# Patient Record
Sex: Female | Born: 1980 | Race: White | Hispanic: No | Marital: Married | Smoking: Never smoker
Health system: Southern US, Community
[De-identification: ages and names within clinical notes are randomized; demographics above are authoritative.]

## PROBLEM LIST (undated history)

## (undated) DIAGNOSIS — O24419 Gestational diabetes mellitus in pregnancy, unspecified control: Secondary | ICD-10-CM

## (undated) DIAGNOSIS — F329 Major depressive disorder, single episode, unspecified: Secondary | ICD-10-CM

## (undated) DIAGNOSIS — K589 Irritable bowel syndrome without diarrhea: Secondary | ICD-10-CM

## (undated) DIAGNOSIS — T7840XA Allergy, unspecified, initial encounter: Secondary | ICD-10-CM

## (undated) DIAGNOSIS — Z789 Other specified health status: Secondary | ICD-10-CM

## (undated) DIAGNOSIS — F419 Anxiety disorder, unspecified: Secondary | ICD-10-CM

## (undated) DIAGNOSIS — F32A Depression, unspecified: Secondary | ICD-10-CM

## (undated) HISTORY — DX: Allergy, unspecified, initial encounter: T78.40XA

---

## 2016-05-31 LAB — OB RESULTS CONSOLE HEPATITIS B SURFACE ANTIGEN: HEP B S AG: NEGATIVE

## 2016-05-31 LAB — OB RESULTS CONSOLE RPR: RPR: NONREACTIVE

## 2016-05-31 LAB — OB RESULTS CONSOLE HIV ANTIBODY (ROUTINE TESTING): HIV: NONREACTIVE

## 2016-05-31 LAB — OB RESULTS CONSOLE GBS: STREP GROUP B AG: POSITIVE

## 2016-06-02 LAB — OB RESULTS CONSOLE RUBELLA ANTIBODY, IGM: RUBELLA: IMMUNE

## 2016-06-02 LAB — OB RESULTS CONSOLE ABO/RH: RH Type: POSITIVE

## 2016-06-02 LAB — OB RESULTS CONSOLE ANTIBODY SCREEN: ANTIBODY SCREEN: NEGATIVE

## 2016-09-13 ENCOUNTER — Inpatient Hospital Stay (HOSPITAL_COMMUNITY): Admission: AD | Admit: 2016-09-13 | Payer: Self-pay | Source: Ambulatory Visit | Admitting: Obstetrics and Gynecology

## 2016-12-17 ENCOUNTER — Inpatient Hospital Stay (HOSPITAL_COMMUNITY): Payer: Commercial Managed Care - PPO | Admitting: Anesthesiology

## 2016-12-17 ENCOUNTER — Encounter (HOSPITAL_COMMUNITY): Payer: Self-pay | Admitting: *Deleted

## 2016-12-17 ENCOUNTER — Inpatient Hospital Stay (HOSPITAL_COMMUNITY)
Admission: AD | Admit: 2016-12-17 | Discharge: 2016-12-21 | DRG: 766 | Disposition: A | Discharge status: Home / Self Care | Payer: Commercial Managed Care - PPO | Source: Ambulatory Visit | Attending: Obstetrics and Gynecology | Admitting: Obstetrics and Gynecology

## 2016-12-17 DIAGNOSIS — E669 Obesity, unspecified: Secondary | ICD-10-CM | POA: Diagnosis present

## 2016-12-17 DIAGNOSIS — Z6832 Body mass index (BMI) 32.0-32.9, adult: Secondary | ICD-10-CM | POA: Diagnosis not present

## 2016-12-17 DIAGNOSIS — O4292 Full-term premature rupture of membranes, unspecified as to length of time between rupture and onset of labor: Secondary | ICD-10-CM | POA: Diagnosis present

## 2016-12-17 DIAGNOSIS — O99824 Streptococcus B carrier state complicating childbirth: Secondary | ICD-10-CM | POA: Diagnosis present

## 2016-12-17 DIAGNOSIS — O324XX0 Maternal care for high head at term, not applicable or unspecified: Principal | ICD-10-CM | POA: Diagnosis present

## 2016-12-17 DIAGNOSIS — O99214 Obesity complicating childbirth: Secondary | ICD-10-CM | POA: Diagnosis present

## 2016-12-17 DIAGNOSIS — Z98891 History of uterine scar from previous surgery: Secondary | ICD-10-CM

## 2016-12-17 DIAGNOSIS — Z3490 Encounter for supervision of normal pregnancy, unspecified, unspecified trimester: Secondary | ICD-10-CM

## 2016-12-17 DIAGNOSIS — Z3A39 39 weeks gestation of pregnancy: Secondary | ICD-10-CM

## 2016-12-17 HISTORY — DX: Other specified health status: Z78.9

## 2016-12-17 LAB — ABO/RH: ABO/RH(D): A POS

## 2016-12-17 LAB — CBC
HEMATOCRIT: 32.1 % — AB (ref 36.0–46.0)
Hemoglobin: 11.2 g/dL — ABNORMAL LOW (ref 12.0–15.0)
MCH: 31 pg (ref 26.0–34.0)
MCHC: 34.9 g/dL (ref 30.0–36.0)
MCV: 88.9 fL (ref 78.0–100.0)
Platelets: 239 10*3/uL (ref 150–400)
RBC: 3.61 MIL/uL — ABNORMAL LOW (ref 3.87–5.11)
RDW: 13.2 % (ref 11.5–15.5)
WBC: 9.6 10*3/uL (ref 4.0–10.5)

## 2016-12-17 LAB — RPR: RPR Ser Ql: NONREACTIVE

## 2016-12-17 LAB — TYPE AND SCREEN
ABO/RH(D): A POS
ANTIBODY SCREEN: NEGATIVE

## 2016-12-17 LAB — POCT FERN TEST: POCT FERN TEST: POSITIVE

## 2016-12-17 MED ORDER — OXYCODONE-ACETAMINOPHEN 5-325 MG PO TABS: 1.0000 | ORAL_TABLET | ORAL | Status: DC | PRN

## 2016-12-17 MED ORDER — ACETAMINOPHEN 325 MG PO TABS: 650.0000 mg | ORAL_TABLET | ORAL | Status: DC | PRN

## 2016-12-17 MED ORDER — LACTATED RINGERS IV SOLN: 500.0000 mL | Freq: Once | INTRAVENOUS | Status: DC

## 2016-12-17 MED ORDER — PENICILLIN G POTASSIUM 5000000 UNITS IJ SOLR: 5.0000 10*6.[IU] | Freq: Once | INTRAVENOUS | Status: DC

## 2016-12-17 MED ORDER — LACTATED RINGERS IV SOLN
INTRAVENOUS | Status: DC
  Administered 2016-12-17: 14:00:00 via INTRAVENOUS

## 2016-12-17 MED ORDER — DIPHENHYDRAMINE HCL 50 MG/ML IJ SOLN: 12.5000 mg | INTRAMUSCULAR | Status: DC | PRN

## 2016-12-17 MED ORDER — BUTORPHANOL TARTRATE 1 MG/ML IJ SOLN: 1.0000 mg | INTRAMUSCULAR | Status: DC | PRN

## 2016-12-17 MED ORDER — FENTANYL 2.5 MCG/ML BUPIVACAINE 1/10 % EPIDURAL INFUSION (WH - ANES)
14.0000 mL/h | INTRAMUSCULAR | Status: DC | PRN
  Administered 2016-12-17 (×3): 14 mL/h via EPIDURAL
  Filled 2016-12-17 (×3): qty 100

## 2016-12-17 MED ORDER — EPHEDRINE 5 MG/ML INJ: 10.0000 mg | INTRAVENOUS | Status: DC | PRN

## 2016-12-17 MED ORDER — LIDOCAINE HCL (PF) 1 % IJ SOLN
30.0000 mL | INTRAMUSCULAR | Status: DC | PRN
  Filled 2016-12-17: qty 30

## 2016-12-17 MED ORDER — LIDOCAINE HCL (PF) 1 % IJ SOLN
INTRAMUSCULAR | Status: DC | PRN
  Administered 2016-12-17: 5 mL via EPIDURAL
  Administered 2016-12-17: 3 mL via EPIDURAL
  Administered 2016-12-17: 2 mL via EPIDURAL

## 2016-12-17 MED ORDER — PENICILLIN G POT IN DEXTROSE 60000 UNIT/ML IV SOLN: 3.0000 10*6.[IU] | INTRAVENOUS | Status: DC

## 2016-12-17 MED ORDER — ONDANSETRON HCL 4 MG/2ML IJ SOLN: 4.0000 mg | Freq: Four times a day (QID) | INTRAMUSCULAR | Status: DC | PRN

## 2016-12-17 MED ORDER — OXYCODONE-ACETAMINOPHEN 5-325 MG PO TABS: 2.0000 | ORAL_TABLET | ORAL | Status: DC | PRN

## 2016-12-17 MED ORDER — OXYTOCIN BOLUS FROM INFUSION: 500.0000 mL | Freq: Once | INTRAVENOUS | Status: DC

## 2016-12-17 MED ORDER — LACTATED RINGERS IV SOLN: 500.0000 mL | INTRAVENOUS | Status: DC | PRN

## 2016-12-17 MED ORDER — PHENYLEPHRINE 40 MCG/ML (10ML) SYRINGE FOR IV PUSH (FOR BLOOD PRESSURE SUPPORT)
80.0000 ug | PREFILLED_SYRINGE | INTRAVENOUS | Status: DC | PRN
  Filled 2016-12-17: qty 10

## 2016-12-17 MED ORDER — PHENYLEPHRINE 40 MCG/ML (10ML) SYRINGE FOR IV PUSH (FOR BLOOD PRESSURE SUPPORT): 80.0000 ug | PREFILLED_SYRINGE | INTRAVENOUS | Status: DC | PRN

## 2016-12-17 MED ORDER — SOD CITRATE-CITRIC ACID 500-334 MG/5ML PO SOLN
30.0000 mL | ORAL | Status: DC | PRN
  Administered 2016-12-18: 30 mL via ORAL
  Filled 2016-12-17: qty 15

## 2016-12-17 MED ORDER — VANCOMYCIN HCL IN DEXTROSE 1-5 GM/200ML-% IV SOLN
1000.0000 mg | Freq: Two times a day (BID) | INTRAVENOUS | Status: DC
  Administered 2016-12-17 (×2): 1000 mg via INTRAVENOUS
  Filled 2016-12-17 (×3): qty 200

## 2016-12-17 MED ORDER — OXYTOCIN 40 UNITS IN LACTATED RINGERS INFUSION - SIMPLE MED
2.5000 [IU]/h | INTRAVENOUS | Status: DC
  Filled 2016-12-17: qty 1000

## 2016-12-17 NOTE — Anesthesia Pain Management Evaluation Note (Signed)
  CRNA Pain Management Visit Note  Patient: Lauren Schroeder, 35 y.o., female  "Hello I am a member of the anesthesia team at [facility]. We have an anesthesia team available at all times to provide care throughout the hospital, including epidural management and anesthesia for C-section. I don't know your plan for the delivery whether it a natural birth, water birth, IV sedation, nitrous supplementation, doula or epidural, but we want to meet your pain goals."   1.Was your pain managed to your expectations on prior hospitalizations?   No prior hospitalizations  2.What is your expectation for pain management during this hospitalization?     Epidural  3.How can we help you reach that goal? unsure  Record the patient's initial score and the patient's pain goal.   Pain: 3  Pain Goal: 7 [facility] wants you to be able to say your pain was always managed very well.  Cephus ShellingBURGER,Lauren Schroeder 12/17/2016

## 2016-12-17 NOTE — Anesthesia Procedure Notes (Signed)
Epidural Patient location during procedure: OB Start time: 12/17/2016 9:00 AM End time: 12/17/2016 9:05 AM  Staffing Anesthesiologist: Cecile HearingURK, STEPHEN EDWARD Performed: anesthesiologist   Preanesthetic Checklist Completed: patient identified, pre-op evaluation, timeout performed, IV checked, risks and benefits discussed and monitors and equipment checked  Epidural Patient position: sitting Prep: DuraPrep Patient monitoring: blood pressure and continuous pulse ox Approach: midline Location: L3-L4 Injection technique: LOR air  Needle:  Needle type: Tuohy  Needle gauge: 17 G Needle length: 9 cm Needle insertion depth: 5 cm Catheter size: 19 Gauge Catheter at skin depth: 10 cm Test dose: negative and Other (1% Lidocaine)  Additional Notes Patient identified.  Risk benefits discussed including failed block, incomplete pain control, headache, nerve damage, paralysis, blood pressure changes, nausea, vomiting, reactions to medication both toxic or allergic, and postpartum back pain.  Patient expressed understanding and wished to proceed.  All questions were answered.  Sterile technique used throughout procedure and epidural site dressed with sterile barrier dressing. No paresthesia or other complications noted. The patient did not experience any signs of intravascular injection such as tinnitus or metallic taste in mouth nor signs of intrathecal spread such as rapid motor block. Please see nursing notes for vital signs. Reason for block:procedure for pain

## 2016-12-17 NOTE — H&P (Signed)
Vallory Madelin RearDillon is a 36 y.o. female G1P0 @ 39+4 presenting for SROM/labor.  Pregnancy uncomplicated.  OB History    Gravida Para Term Preterm AB Living   1 0 0 0 0 0   SAB TAB Ectopic Multiple Live Births   0 0 0 0 0     Past Medical History:  Diagnosis Date  . Medical history non-contributory    No past surgical history on file. Family History: family history is not on file. Social History:  reports that she has never smoked. She has never used smokeless tobacco. She reports that she does not drink alcohol or use drugs.     Maternal Diabetes: No Genetic Screening: Normal Maternal Ultrasounds/Referrals: Normal Fetal Ultrasounds or other Referrals:  None Maternal Substance Abuse:  No Significant Maternal Medications:  Meds include: Other: celexa Significant Maternal Lab Results:  None Other Comments:  None  ROS History Dilation: 6 Effacement (%): 100 Station: -1 Exam by:: Darell Saputo Blood pressure 113/70, pulse 78, temperature 98.1 F (36.7 C), temperature source Oral, resp. rate 18, height 5\' 3"  (1.6 m), weight 182 lb (82.6 kg), SpO2 99 %. Exam Physical Exam  Prenatal labs: ABO, Rh: --/--/A POS (02/02 16100817) Antibody: NEG (02/02 0817) Rubella: Immune (07/19 0000) RPR: Nonreactive (07/17 0000)  HBsAg: Negative (07/17 0000)  HIV: Non-reactive (07/17 0000)  GBS: Positive (07/17 0000)   Assessment/Plan: Labor Vancomycin for GBS Epidural    Kacee Koren 12/17/2016, 10:09 AM

## 2016-12-17 NOTE — Anesthesia Preprocedure Evaluation (Addendum)
Anesthesia Evaluation  Patient identified by MRN, date of birth, ID band Patient awake    Reviewed: Allergy & Precautions, NPO status , Patient's Chart, lab work & pertinent test results  Airway Mallampati: II  TM Distance: >3 FB Neck ROM: Full    Dental  (+) Teeth Intact, Dental Advisory Given   Pulmonary neg pulmonary ROS,    Pulmonary exam normal breath sounds clear to auscultation       Cardiovascular negative cardio ROS Normal cardiovascular exam Rhythm:Regular Rate:Normal     Neuro/Psych negative neurological ROS     GI/Hepatic negative GI ROS, Neg liver ROS,   Endo/Other  Obesity   Renal/GU negative Renal ROS     Musculoskeletal negative musculoskeletal ROS (+)   Abdominal   Peds  Hematology  (+) Blood dyscrasia, anemia , Plt 239k   Anesthesia Other Findings Day of surgery medications reviewed with the patient.  Reproductive/Obstetrics (+) Pregnancy                             Anesthesia Physical Anesthesia Plan  ASA: II  Anesthesia Plan: Epidural   Post-op Pain Management:    Induction:   Airway Management Planned: Natural Airway  Additional Equipment:   Intra-op Plan:   Post-operative Plan:   Informed Consent: I have reviewed the patients History and Physical, chart, labs and discussed the procedure including the risks, benefits and alternatives for the proposed anesthesia with the patient or authorized representative who has indicated his/her understanding and acceptance.   Dental advisory given  Plan Discussed with: Anesthesiologist, CRNA and Surgeon  Anesthesia Plan Comments: (Patient identified. Risks/Benefits/Options discussed with patient including but not limited to bleeding, infection, nerve damage, paralysis, failed block, incomplete pain control, headache, blood pressure changes, nausea, vomiting, reactions to medication both or allergic, itching and  postpartum back pain. Confirmed with bedside nurse the patient's most recent platelet count. Confirmed with patient that they are not currently taking any anticoagulation, have any bleeding history or any family history of bleeding disorders. Patient expressed understanding and wished to proceed. All questions were answered.   Patient for C/Section for arrest of descent. Will use Epidural for C/Section. M. Aivan Fillingim,MD)       Anesthesia Quick Evaluation

## 2016-12-17 NOTE — MAU Note (Signed)
Patient presents to mau with c/o leaking clear fluid since 2am. Endorses contractions. Denies VB at this time. +FM. Patient desires an epidural.

## 2016-12-18 ENCOUNTER — Encounter (HOSPITAL_COMMUNITY): Payer: Self-pay

## 2016-12-18 ENCOUNTER — Encounter (HOSPITAL_COMMUNITY)
Admission: AD | Disposition: A | Discharge status: Home / Self Care | Payer: Self-pay | Source: Ambulatory Visit | Attending: Obstetrics and Gynecology

## 2016-12-18 DIAGNOSIS — Z98891 History of uterine scar from previous surgery: Secondary | ICD-10-CM

## 2016-12-18 LAB — CBC
HCT: 25.9 % — ABNORMAL LOW (ref 36.0–46.0)
HEMOGLOBIN: 9.1 g/dL — AB (ref 12.0–15.0)
MCH: 31.6 pg (ref 26.0–34.0)
MCHC: 35.1 g/dL (ref 30.0–36.0)
MCV: 89.9 fL (ref 78.0–100.0)
Platelets: 181 10*3/uL (ref 150–400)
RBC: 2.88 MIL/uL — AB (ref 3.87–5.11)
RDW: 13.4 % (ref 11.5–15.5)
WBC: 11.9 10*3/uL — AB (ref 4.0–10.5)

## 2016-12-18 SURGERY — Surgical Case
Anesthesia: Epidural

## 2016-12-18 MED ORDER — MORPHINE SULFATE (PF) 0.5 MG/ML IJ SOLN
INTRAMUSCULAR | Status: AC
  Filled 2016-12-18: qty 10

## 2016-12-18 MED ORDER — DIBUCAINE 1 % RE OINT: 1.0000 "application " | TOPICAL_OINTMENT | RECTAL | Status: DC | PRN

## 2016-12-18 MED ORDER — ONDANSETRON HCL 4 MG/2ML IJ SOLN
INTRAMUSCULAR | Status: DC | PRN
  Administered 2016-12-18: 4 mg via INTRAVENOUS

## 2016-12-18 MED ORDER — ACETAMINOPHEN 500 MG PO TABS
1000.0000 mg | ORAL_TABLET | Freq: Four times a day (QID) | ORAL | Status: AC
  Administered 2016-12-18 – 2016-12-19 (×3): 1000 mg via ORAL
  Filled 2016-12-18 (×3): qty 2

## 2016-12-18 MED ORDER — SENNOSIDES-DOCUSATE SODIUM 8.6-50 MG PO TABS
2.0000 | ORAL_TABLET | ORAL | Status: DC
  Administered 2016-12-19 – 2016-12-20 (×2): 2 via ORAL
  Filled 2016-12-18 (×2): qty 2

## 2016-12-18 MED ORDER — ONDANSETRON HCL 4 MG/2ML IJ SOLN: 4.0000 mg | Freq: Three times a day (TID) | INTRAMUSCULAR | Status: DC | PRN

## 2016-12-18 MED ORDER — NALOXONE HCL 0.4 MG/ML IJ SOLN: 0.4000 mg | INTRAMUSCULAR | Status: DC | PRN

## 2016-12-18 MED ORDER — SIMETHICONE 80 MG PO CHEW
80.0000 mg | CHEWABLE_TABLET | Freq: Three times a day (TID) | ORAL | Status: DC
  Administered 2016-12-18 – 2016-12-21 (×10): 80 mg via ORAL
  Filled 2016-12-18 (×10): qty 1

## 2016-12-18 MED ORDER — ACETAMINOPHEN 325 MG PO TABS
650.0000 mg | ORAL_TABLET | ORAL | Status: DC | PRN
  Administered 2016-12-19 (×2): 650 mg via ORAL
  Filled 2016-12-18: qty 2

## 2016-12-18 MED ORDER — CLINDAMYCIN PHOSPHATE 900 MG/50ML IV SOLN: 900.0000 mg | Freq: Once | INTRAVENOUS | Status: DC

## 2016-12-18 MED ORDER — NALBUPHINE HCL 10 MG/ML IJ SOLN: 5.0000 mg | INTRAMUSCULAR | Status: DC | PRN

## 2016-12-18 MED ORDER — MORPHINE SULFATE (PF) 0.5 MG/ML IJ SOLN
INTRAMUSCULAR | Status: DC | PRN
  Administered 2016-12-18: 4 mg via EPIDURAL

## 2016-12-18 MED ORDER — SIMETHICONE 80 MG PO CHEW
80.0000 mg | CHEWABLE_TABLET | ORAL | Status: DC
  Administered 2016-12-19 – 2016-12-20 (×2): 80 mg via ORAL
  Filled 2016-12-18 (×2): qty 1

## 2016-12-18 MED ORDER — LIDOCAINE-EPINEPHRINE (PF) 2 %-1:200000 IJ SOLN
INTRAMUSCULAR | Status: AC
Start: 2016-12-18 — End: 2016-12-18
  Filled 2016-12-18: qty 20

## 2016-12-18 MED ORDER — MEPERIDINE HCL 25 MG/ML IJ SOLN
INTRAMUSCULAR | Status: AC
  Filled 2016-12-18: qty 1

## 2016-12-18 MED ORDER — SCOPOLAMINE 1 MG/3DAYS TD PT72
MEDICATED_PATCH | TRANSDERMAL | Status: DC | PRN
  Administered 2016-12-18: 1 via TRANSDERMAL

## 2016-12-18 MED ORDER — ONDANSETRON HCL 4 MG/2ML IJ SOLN
INTRAMUSCULAR | Status: AC
  Filled 2016-12-18: qty 2

## 2016-12-18 MED ORDER — MEASLES, MUMPS & RUBELLA VAC ~~LOC~~ INJ
0.5000 mL | INJECTION | Freq: Once | SUBCUTANEOUS | Status: DC
  Filled 2016-12-18: qty 0.5

## 2016-12-18 MED ORDER — COCONUT OIL OIL
1.0000 "application " | TOPICAL_OIL | Status: DC | PRN
  Administered 2016-12-19: 1 via TOPICAL
  Filled 2016-12-18: qty 120

## 2016-12-18 MED ORDER — OXYTOCIN 40 UNITS IN LACTATED RINGERS INFUSION - SIMPLE MED: 2.5000 [IU]/h | INTRAVENOUS | Status: AC

## 2016-12-18 MED ORDER — DIPHENHYDRAMINE HCL 25 MG PO CAPS: 25.0000 mg | ORAL_CAPSULE | Freq: Four times a day (QID) | ORAL | Status: DC | PRN

## 2016-12-18 MED ORDER — SODIUM CHLORIDE 0.9 % IV SOLN: INTRAVENOUS | Status: DC | PRN

## 2016-12-18 MED ORDER — LACTATED RINGERS IV SOLN
INTRAVENOUS | Status: DC | PRN
  Administered 2016-12-18 (×3): via INTRAVENOUS

## 2016-12-18 MED ORDER — WITCH HAZEL-GLYCERIN EX PADS: 1.0000 "application " | MEDICATED_PAD | CUTANEOUS | Status: DC | PRN

## 2016-12-18 MED ORDER — MEDROXYPROGESTERONE ACETATE 150 MG/ML IM SUSP: 150.0000 mg | INTRAMUSCULAR | Status: DC | PRN

## 2016-12-18 MED ORDER — OXYTOCIN 10 UNIT/ML IJ SOLN
INTRAMUSCULAR | Status: AC
Start: 2016-12-18 — End: 2016-12-18
  Filled 2016-12-18: qty 4

## 2016-12-18 MED ORDER — DIPHENHYDRAMINE HCL 50 MG/ML IJ SOLN: 12.5000 mg | INTRAMUSCULAR | Status: DC | PRN

## 2016-12-18 MED ORDER — CITALOPRAM HYDROBROMIDE 40 MG PO TABS
40.0000 mg | ORAL_TABLET | Freq: Every day | ORAL | Status: DC
  Administered 2016-12-18 – 2016-12-20 (×3): 40 mg via ORAL
  Filled 2016-12-18 (×3): qty 1

## 2016-12-18 MED ORDER — SIMETHICONE 80 MG PO CHEW: 80.0000 mg | CHEWABLE_TABLET | ORAL | Status: DC | PRN

## 2016-12-18 MED ORDER — SODIUM BICARBONATE 8.4 % IV SOLN
INTRAVENOUS | Status: AC
  Filled 2016-12-18: qty 50

## 2016-12-18 MED ORDER — SODIUM BICARBONATE 8.4 % IV SOLN
INTRAVENOUS | Status: DC | PRN
  Administered 2016-12-18 (×4): 5 mL via EPIDURAL

## 2016-12-18 MED ORDER — IBUPROFEN 600 MG PO TABS
600.0000 mg | ORAL_TABLET | Freq: Four times a day (QID) | ORAL | Status: DC | PRN
  Administered 2016-12-19 – 2016-12-20 (×2): 600 mg via ORAL

## 2016-12-18 MED ORDER — MEPERIDINE HCL 25 MG/ML IJ SOLN
INTRAMUSCULAR | Status: DC | PRN
  Administered 2016-12-18 (×2): 12.5 mg via INTRAVENOUS

## 2016-12-18 MED ORDER — MEPERIDINE HCL 25 MG/ML IJ SOLN: 6.2500 mg | INTRAMUSCULAR | Status: DC | PRN

## 2016-12-18 MED ORDER — IBUPROFEN 600 MG PO TABS
600.0000 mg | ORAL_TABLET | Freq: Four times a day (QID) | ORAL | Status: DC
  Administered 2016-12-18 – 2016-12-21 (×9): 600 mg via ORAL
  Filled 2016-12-18 (×13): qty 1

## 2016-12-18 MED ORDER — NALOXONE HCL 2 MG/2ML IJ SOSY
1.0000 ug/kg/h | PREFILLED_SYRINGE | INTRAMUSCULAR | Status: DC | PRN
  Filled 2016-12-18: qty 2

## 2016-12-18 MED ORDER — OXYTOCIN 10 UNIT/ML IJ SOLN
INTRAVENOUS | Status: DC | PRN
  Administered 2016-12-18: 40 [IU] via INTRAVENOUS

## 2016-12-18 MED ORDER — TETANUS-DIPHTH-ACELL PERTUSSIS 5-2.5-18.5 LF-MCG/0.5 IM SUSP: 0.5000 mL | Freq: Once | INTRAMUSCULAR | Status: DC

## 2016-12-18 MED ORDER — PRENATAL MULTIVITAMIN CH
1.0000 | ORAL_TABLET | Freq: Every day | ORAL | Status: DC
  Administered 2016-12-19 – 2016-12-20 (×2): 1 via ORAL
  Filled 2016-12-18 (×3): qty 1

## 2016-12-18 MED ORDER — DIPHENHYDRAMINE HCL 25 MG PO CAPS
25.0000 mg | ORAL_CAPSULE | ORAL | Status: DC | PRN
  Filled 2016-12-18: qty 1

## 2016-12-18 MED ORDER — KETOROLAC TROMETHAMINE 30 MG/ML IJ SOLN: 30.0000 mg | Freq: Four times a day (QID) | INTRAMUSCULAR | Status: AC | PRN

## 2016-12-18 MED ORDER — GENTAMICIN SULFATE 40 MG/ML IJ SOLN
Freq: Once | INTRAVENOUS | Status: AC
  Administered 2016-12-18: 114 mL via INTRAVENOUS
  Filled 2016-12-18: qty 8

## 2016-12-18 MED ORDER — GENTAMICIN SULFATE 40 MG/ML IJ SOLN: 1.5000 mg/kg | Freq: Once | INTRAVENOUS | Status: DC

## 2016-12-18 MED ORDER — OXYCODONE-ACETAMINOPHEN 5-325 MG PO TABS
1.0000 | ORAL_TABLET | ORAL | Status: DC | PRN
  Administered 2016-12-19 – 2016-12-20 (×2): 1 via ORAL
  Filled 2016-12-18 (×2): qty 1

## 2016-12-18 MED ORDER — FENTANYL CITRATE (PF) 100 MCG/2ML IJ SOLN
INTRAMUSCULAR | Status: AC
  Filled 2016-12-18: qty 2

## 2016-12-18 MED ORDER — NALBUPHINE HCL 10 MG/ML IJ SOLN: 5.0000 mg | Freq: Once | INTRAMUSCULAR | Status: DC | PRN

## 2016-12-18 MED ORDER — KETOROLAC TROMETHAMINE 30 MG/ML IJ SOLN
INTRAMUSCULAR | Status: AC
  Filled 2016-12-18: qty 1

## 2016-12-18 MED ORDER — SODIUM CHLORIDE 0.9% FLUSH: 3.0000 mL | INTRAVENOUS | Status: DC | PRN

## 2016-12-18 MED ORDER — OXYCODONE-ACETAMINOPHEN 5-325 MG PO TABS
2.0000 | ORAL_TABLET | ORAL | Status: DC | PRN
  Administered 2016-12-20: 2 via ORAL
  Filled 2016-12-18: qty 2

## 2016-12-18 MED ORDER — SODIUM CHLORIDE 0.9 % IR SOLN
Status: DC | PRN
  Administered 2016-12-18 (×2): 1

## 2016-12-18 MED ORDER — DEXTROSE IN LACTATED RINGERS 5 % IV SOLN
INTRAVENOUS | Status: DC
  Administered 2016-12-18 (×2): via INTRAVENOUS

## 2016-12-18 MED ORDER — FENTANYL CITRATE (PF) 100 MCG/2ML IJ SOLN
25.0000 ug | INTRAMUSCULAR | Status: DC | PRN
  Administered 2016-12-18 (×2): 50 ug via INTRAVENOUS
  Administered 2016-12-18: 25 ug via INTRAVENOUS

## 2016-12-18 MED ORDER — MENTHOL 3 MG MT LOZG: 1.0000 | LOZENGE | OROMUCOSAL | Status: DC | PRN

## 2016-12-18 MED ORDER — SCOPOLAMINE 1 MG/3DAYS TD PT72
MEDICATED_PATCH | TRANSDERMAL | Status: AC
  Filled 2016-12-18: qty 1

## 2016-12-18 MED ORDER — KETOROLAC TROMETHAMINE 30 MG/ML IJ SOLN
30.0000 mg | Freq: Four times a day (QID) | INTRAMUSCULAR | Status: AC | PRN
  Administered 2016-12-18: 30 mg via INTRAMUSCULAR

## 2016-12-18 SURGICAL SUPPLY — 31 items
CHLORAPREP W/TINT 26ML (MISCELLANEOUS) ×3 IMPLANT
CLAMP CORD UMBIL (MISCELLANEOUS) IMPLANT
CLOTH BEACON ORANGE TIMEOUT ST (SAFETY) ×3 IMPLANT
DERMABOND ADVANCED (GAUZE/BANDAGES/DRESSINGS) ×2
DERMABOND ADVANCED .7 DNX12 (GAUZE/BANDAGES/DRESSINGS) ×1 IMPLANT
DRSG OPSITE 11X17.75 LRG (GAUZE/BANDAGES/DRESSINGS) ×3 IMPLANT
DRSG OPSITE POSTOP 4X10 (GAUZE/BANDAGES/DRESSINGS) ×3 IMPLANT
ELECT REM PT RETURN 9FT ADLT (ELECTROSURGICAL) ×3
ELECTRODE REM PT RTRN 9FT ADLT (ELECTROSURGICAL) ×1 IMPLANT
EXTRACTOR VACUUM M CUP 4 TUBE (SUCTIONS) IMPLANT
EXTRACTOR VACUUM M CUP 4' TUBE (SUCTIONS)
GLOVE BIO SURGEON STRL SZ 6.5 (GLOVE) ×2 IMPLANT
GLOVE BIO SURGEONS STRL SZ 6.5 (GLOVE) ×1
GLOVE BIOGEL PI IND STRL 7.0 (GLOVE) ×2 IMPLANT
GLOVE BIOGEL PI INDICATOR 7.0 (GLOVE) ×4
GOWN STRL REUS W/TWL LRG LVL3 (GOWN DISPOSABLE) ×6 IMPLANT
KIT ABG SYR 3ML LUER SLIP (SYRINGE) IMPLANT
NEEDLE HYPO 25X5/8 SAFETYGLIDE (NEEDLE) IMPLANT
NS IRRIG 1000ML POUR BTL (IV SOLUTION) ×3 IMPLANT
PACK C SECTION WH (CUSTOM PROCEDURE TRAY) ×3 IMPLANT
PAD OB MATERNITY 4.3X12.25 (PERSONAL CARE ITEMS) ×3 IMPLANT
PENCIL SMOKE EVAC W/HOLSTER (ELECTROSURGICAL) ×3 IMPLANT
SUT CHROMIC 0 CT 802H (SUTURE) IMPLANT
SUT CHROMIC 0 CTX 36 (SUTURE) ×15 IMPLANT
SUT MON AB-0 CT1 36 (SUTURE) ×3 IMPLANT
SUT PDS AB 0 CTX 60 (SUTURE) ×3 IMPLANT
SUT PLAIN 0 NONE (SUTURE) IMPLANT
SUT VIC AB 4-0 KS 27 (SUTURE) IMPLANT
SYR BULB 3OZ (MISCELLANEOUS) ×3 IMPLANT
TOWEL OR 17X24 6PK STRL BLUE (TOWEL DISPOSABLE) ×3 IMPLANT
TRAY FOLEY CATH SILVER 14FR (SET/KITS/TRAYS/PACK) IMPLANT

## 2016-12-18 NOTE — Addendum Note (Signed)
Addendum  created 12/18/16 0844 by Junious SilkMelinda Panayiotis [place], CRNA   Charge Capture section accepted, Sign clinical note

## 2016-12-18 NOTE — Progress Notes (Signed)
Subjective: Postpartum Day 1: Cesarean Delivery Patient reports tolerating PO.    Objective: Vital signs in last 24 hours: Temp:  [97.8 F (36.6 C)-100.3 F (37.9 C)] 97.8 F (36.6 C) (02/03 0730) Pulse Rate:  [73-110] 86 (02/03 0730) Resp:  [11-21] 18 (02/03 0730) BP: (94-133)/(48-84) 111/65 (02/03 0730) SpO2:  [94 %-99 %] 98 % (02/03 0730)  Physical Exam:  General: alert and cooperative Lochia: appropriate Uterine Fundus: firm Incision: healing well DVT Evaluation: No evidence of DVT seen on physical exam.   Recent Labs  12/17/16 0817 12/18/16 0615  HGB 11.2* 9.1*  HCT 32.1* 25.9*    Assessment/Plan: Status post Cesarean section. Doing well postoperatively.  Continue current care.  Cletis Muma 12/18/2016, 9:18 AM

## 2016-12-18 NOTE — Lactation Note (Signed)
This note was copied from a baby's chart. Lactation Consultation Note  Patient Name: Lauren Schroeder Today's Date: 12/18/2016 Reason for consult: Initial assessment Infant is 5612 hours old & seen by lactation for initial assessment. Baby was born at 7165w5d and weighed 7 lb 7.2 oz at birth. Baby was asleep in crib when LC entered. Mom reports that they have attempted BF but he has been sleepy and not wanting to suckle. Mom has done some hand expression into a spoon & fed baby her colostrum this way. Mom encouraged to feed baby 8-12 times/24 hours and with feeding cues.  Provided mom with BF booklet, BF resources, and feeding log; mom made aware of O/P services, breastfeeding support groups, community resources, and our phone # for post-discharge questions.  Mom reports she has a Spectra pump and would like some assistance later with how to use it. Discussed how for now, it's important to BF 8-12x in 24hrs and only pump if needed but wait to focus on pumping for returning to work after BF is established.  Encouraged mom to call for lactation at future feedings to assist with latch.     Maternal Data Has patient been taught Hand Expression?: Yes Does the patient have breastfeeding experience prior to this delivery?: No  Feeding    LATCH Score/Interventions                      Lactation Tools Discussed/Used     Consult Status Consult Status: Follow-up Date: 12/19/16 Follow-up type: In-patient    Oneal GroutLaura C Aloysius Heinle 12/18/2016, 3:03 PM

## 2016-12-18 NOTE — Op Note (Signed)
Cesarean Section Procedure Note   Lauren Schroeder  12/17/2016 - 12/18/2016  Indications: arrest of descent   Pre-operative Diagnosis: Primary Cesarean Section for arrest of descent.   Post-operative Diagnosis: Same   Surgeon: Surgeon(s) and Role:    * Zelphia CairoGretchen Brynnlee Cumpian, MD - Primary    * Lazaro ArmsLuther H Eure, MD - Assisting   Assistants: Despina HiddenEure  Anesthesia: epidural   Procedure Details:  The patient was seen in the Holding Room. The risks, benefits, complications, treatment options, and expected outcomes were discussed with the patient. The patient concurred with the proposed plan, giving informed consent. identified as Lauren Schroeder and the procedure verified as C-Section Delivery. A Time Out was held and the above information confirmed.  After induction of anesthesia, the patient was draped and prepped in the usual sterile manner. A transverse was made and carried down through the subcutaneous tissue to the fascia. Fascial incision was made and extended transversely. The fascia was separated from the underlying rectus tissue superiorly and inferiorly. The peritoneum was identified and entered. Peritoneal incision was extended longitudinally. The utero-vesical peritoneal reflection was incised transversely and the bladder flap was bluntly freed from the lower uterine segment. A low transverse uterine incision was made. Delivered from cephalic presentation was a viable female infant.   Cord ph was not sent the umbilical cord was clamped and cut cord blood was obtained for evaluation. The placenta was removed Intact and appeared normal. The uterine outline, tubes and ovaries appeared normal}. The uterine incision was closed with running locked sutures of 0 chromic gut.   Hemostasis was observed. Lavage was carried out until clear.  Peritoneum closed with 0 monocryl. The fascia was then reapproximated with running sutures of 0PDS. The skin was closed with 4-0Vicryl.   Instrument, sponge, and needle counts were  correct prior the abdominal closure and were correct at the conclusion of the case.      Urine Output: clear  Complications: no complications  Disposition: PACU - hemodynamically stable.   Maternal Condition: stable   Baby condition / location:  Couplet care / Skin to Skin  Attending Attestation: I was present and scrubbed for the entire procedure.   Signed: Surgeon(s): Zelphia CairoGretchen Jabre Heo, MD Lazaro ArmsLuther H Eure, MD

## 2016-12-18 NOTE — Anesthesia Postprocedure Evaluation (Signed)
Anesthesia Post Note  Patient: Lauren Schroeder  Procedure(s) Performed: Procedure(s) (LRB): CESAREAN SECTION (N/A)  Patient location during evaluation: PACU Anesthesia Type: Epidural Level of consciousness: awake and alert and oriented Pain management: pain level controlled Vital Signs Assessment: post-procedure vital signs reviewed and stable Respiratory status: spontaneous breathing, nonlabored ventilation and respiratory function stable Cardiovascular status: blood pressure returned to baseline and stable Postop Assessment: no signs of nausea or vomiting, no headache, epidural receding, no backache and patient able to bend at knees Anesthetic complications: no        Last Vitals:  Vitals:   12/18/16 0330 12/18/16 0345  BP: 100/75 108/69  Pulse: 87 87  Resp: 18 18  Temp: 36.8 C     Last Pain:  Vitals:   12/18/16 0345  TempSrc:   PainSc: 5    Pain Goal: Patients Stated Pain Goal: 0 (12/17/16 2131)               Yudith Norlander A.

## 2016-12-18 NOTE — Anesthesia Postprocedure Evaluation (Signed)
Anesthesia Post Note  Patient: Lauren Schroeder  Procedure(s) Performed: Procedure(s) (LRB): CESAREAN SECTION (N/A)  Patient location during evaluation: Mother Baby Anesthesia Type: Epidural Level of consciousness: awake and alert Pain management: pain level controlled Vital Signs Assessment: post-procedure vital signs reviewed and stable Respiratory status: spontaneous breathing, nonlabored ventilation and respiratory function stable Cardiovascular status: stable Postop Assessment: no headache, no backache and epidural receding Anesthetic complications: no        Last Vitals:  Vitals:   12/18/16 0530 12/18/16 0628  BP: (!) 118/59 (!) 105/56  Pulse: 79 80  Resp: 18 18  Temp: 36.8 C 36.6 C    Last Pain:  Vitals:   12/18/16 0628  TempSrc: Oral  PainSc: 0-No pain   Pain Goal: Patients Stated Pain Goal: 0 (12/17/16 2131)               Junious SilkGILBERT,Yerik Zeringue

## 2016-12-18 NOTE — Transfer of Care (Signed)
Immediate Anesthesia Transfer of Care Note  Patient: Lauren Schroeder  Procedure(s) Performed: Procedure(s): CESAREAN SECTION (N/A)  Patient Location: PACU  Anesthesia Type:Epidural  Level of Consciousness: awake  Airway & Oxygen Therapy: Patient Spontanous Breathing  Post-op Assessment: Report given to RN and Post -op Vital signs reviewed and stable  Post vital signs: stable  Last Vitals:  Vitals:   12/18/16 0101 12/18/16 0120  BP: 105/73   Pulse: 97   Resp:    Temp:  36.9 C    Last Pain:  Vitals:   12/18/16 0120  TempSrc: Oral  PainSc:       Patients Stated Pain Goal: 0 (12/17/16 2131)  Complications: No apparent anesthesia complications

## 2016-12-19 ENCOUNTER — Encounter (HOSPITAL_COMMUNITY): Payer: Self-pay | Admitting: Obstetrics and Gynecology

## 2016-12-19 NOTE — Plan of Care (Signed)
Problem: Coping: Goal: Ability to identify and utilize available resources and services will improve Outcome: Progressing Lactation specialist spent time with parents this evening actively helping by assessing baby's ability to latch, exploring compatible options to placing baby to breast, ways to help baby learn to effectively suck.  Mom is able to express 2-2.5 mls of colostrum per recent feeding attempts.

## 2016-12-19 NOTE — Lactation Note (Signed)
This note was copied from a baby's chart. Lactation Consultation Note  Patient Name: Lauren Schroeder Today's Date: 12/19/2016 Reason for consult: Follow-up assessment;Difficult latch   Follow up with mom of 38 hour old infant. Infant with 2 BF, 4 BF attempts, EBM x 3 of 1-2.5 cc, formula x 2 of 10 cc, 4 voids and 4 stool in 24 hours preceding this assessment. LATCH scores 6-7. Infant weight 6 lb 14.4 oz with weight loss of 7%.  Room is dark and family attempting to get a nap. Mom aroused to speak with me briefly. Mom reports RN has been assisting her with feedings and feedings are improving. She has no questions/concerns at this time and [facility].    Maternal Data Formula Feeding for Exclusion: No Has patient been taught Hand Expression?: Yes Does the patient have breastfeeding experience prior to this delivery?: No  Feeding Feeding Type: Breast Milk Length of feed: 15 min  LATCH Score/Interventions Latch: Repeated attempts needed to sustain latch, nipple held in mouth throughout feeding, stimulation needed to elicit sucking reflex. Intervention(s): Skin to skin;Teach feeding cues Intervention(s): Adjust position;Assist with latch;Breast massage  Audible Swallowing: A few with stimulation Intervention(s): Skin to skin;Hand expression Intervention(s): Skin to skin;Hand expression  Type of Nipple: Flat Intervention(s): Hand pump  Comfort (Breast/Nipple): Soft / non-tender     Hold (Positioning): Assistance needed to correctly position infant at breast and maintain latch.  LATCH Score: 6  Lactation Tools Discussed/Used     Consult Status Consult Status: Follow-up Date: 12/20/16 Follow-up type: In-patient    Silas FloodSharon S Zylah Elsbernd 12/19/2016, 4:25 PM

## 2016-12-19 NOTE — Plan of Care (Signed)
Problem: Bowel/Gastric: Goal: Gastrointestinal status will improve Outcome: Progressing Bowel sounds audible   

## 2016-12-19 NOTE — Lactation Note (Signed)
This note was copied from a baby's chart. Lactation Consultation Note  Patient Name: Lauren Schroeder Today's Date: 12/19/2016 Reason for consult: Follow-up assessment;Difficult latch   Follow up with mom at request of mom and RN. Mom reports infant has not fed well today. She reports he has been very gassy and passing a lot of gas and burping. Assisted mom in latching infant. He was noted to have mucous in his throat with repeat episodes of repetitive swallowing. Initially infant would not latch and was biting at the breast.   He was noted to be biting and have his tongue pulled back in his mouth when suckling on a gloved finger. Enc parents to perform suck training prior to latch. Infant did not get into a rhythmic suckling pattern on gloved finger. Mom hand expresed and spoon fed infant 2 cc colostrum. After trying for several minutes infant began having short suckling bursts but would not sustain latch. Mom with firm breasts, semi compressible areola and short shaft everted nipples. We then attempted a # 24 NS, infant did feed for about 10 minutes with use of NS. Showed mom how to apply and clean NS. Enc mom to use NS if unable to get infant to stay on the breast without it.   Discussed with mom that pumping needs to be started within 24 hours if consistently using the NS, mom wanted to begin pumping this evening using her Spectra 2. Mom pumped and obtained about 3 cc colostrum, enc mom to feed infant any EBM that is obtained. Discussed with parents cleaning of pump parts and breast milk storage at room temperature.   Enc mom to feed infant STS 8-12 x in 24 hours at first feeding cues. Enc mom to pump post BF and to feed infant EBM either in NS or with curved tip syringe and finger. Report to Jan, Charity fundraiserN. Enc mom to call out for feeding assistance as needed.    Maternal Data Formula Feeding for Exclusion: No Has patient been taught Hand Expression?: Yes Does the patient have breastfeeding experience  prior to this delivery?: No  Feeding Feeding Type: Breast Fed Length of feed: 10 min  LATCH Score/Interventions Latch: Repeated attempts needed to sustain latch, nipple held in mouth throughout feeding, stimulation needed to elicit sucking reflex. Intervention(s): Skin to skin;Teach feeding cues;Waking techniques Intervention(s): Adjust position;Assist with latch;Breast massage;Breast compression  Audible Swallowing: A few with stimulation Intervention(s): Alternate breast massage;Hand expression;Skin to skin  Type of Nipple: Everted at rest and after stimulation  Comfort (Breast/Nipple): Soft / non-tender     Hold (Positioning): Assistance needed to correctly position infant at breast and maintain latch. Intervention(s): Breastfeeding basics reviewed;Support Pillows;Position options;Skin to skin  LATCH Score: 7  Lactation Tools Discussed/Used Tools: Pump;Nipple Shields Nipple shield size: 24 Breast pump type: Double-Electric Breast Pump (Spectra 2) WIC Program: No Pump Review: Setup, frequency, and cleaning;Milk Storage Initiated by:: Lauren StainSharon Carle Dargan, RN, IBCLC Date initiated:: 12/19/16   Consult Status Consult Status: Follow-up Date: 12/19/16 Follow-up type: In-patient    Lauren Schroeder 12/19/2016, 12:28 AM

## 2016-12-19 NOTE — Progress Notes (Signed)
Subjective: Postpartum Day 1: Cesarean Delivery Patient reports incisional pain, tolerating PO and no problems voiding.  Baby not feeding well.  Objective: Vital signs in last 24 hours: Temp:  [97.6 F (36.4 C)-98.4 F (36.9 C)] 97.7 F (36.5 C) (02/04 0700) Pulse Rate:  [78-86] 82 (02/04 0700) Resp:  [16-18] 18 (02/04 0700) BP: (100-111)/(52-62) 110/57 (02/04 0700) SpO2:  [95 %-100 %] 100 % (02/04 0316)  Physical Exam:  General: alert and cooperative Lochia: appropriate Uterine Fundus: firm Incision: healing well DVT Evaluation: No evidence of DVT seen on physical exam.   Recent Labs  12/17/16 0817 12/18/16 0615  HGB 11.2* 9.1*  HCT 32.1* 25.9*    Assessment/Plan: Status post Cesarean section. Doing well postoperatively.  Continue current care.  Maddux Vanscyoc 12/19/2016, 9:46 AM

## 2016-12-20 ENCOUNTER — Encounter (HOSPITAL_COMMUNITY): Payer: Self-pay | Admitting: General Practice

## 2016-12-20 NOTE — Lactation Note (Addendum)
This note was copied from a baby's chart. Lactation Consultation Note Baby had 9% weight loss. Very skin tone jaundice in appearance. Baby will have bili serum drawn at 0500. Baby hasn't been feeding well. Out put had been good until last 24 hrs. Baby had 9 stools, 7 voids since birth. Explained to parent baby had an extreme large amount first day of life, sometimes may not have a stool for 24 hrs. But after that should be having stools regular again. Mom and FOB are supplementing w/Alimentum after BF. Pumping, giving colostrum then supplementing w/formula. Mom is usinf personal pump. Encouraged to hand express after pumping. Moms breast are starting to fill. Massage breast easy expressed colostrum. Gave mom feeding amount feeding sheet for supplementing. Parents are syring feeding. Demonstrated inserting syring of formula into NS before latching when baby became sleepy. Baby latched and BF well.  Patient Name: Lauren Schroeder Today's Date: 12/20/2016 Reason for consult: Follow-up assessment;Hyperbilirubinemia;Infant weight loss   Maternal Data    Feeding Feeding Type: Breast Fed Length of feed: 20 min  LATCH Score/Interventions Latch: Repeated attempts needed to sustain latch, nipple held in mouth throughout feeding, stimulation needed to elicit sucking reflex. Intervention(s): Skin to skin;Teach feeding cues;Waking techniques Intervention(s): Adjust position;Assist with latch;Breast massage;Breast compression  Audible Swallowing: A few with stimulation Intervention(s): Hand expression;Skin to skin  Type of Nipple: Everted at rest and after stimulation Intervention(s): Double electric pump  Comfort (Breast/Nipple): Filling, red/small blisters or bruises, mild/mod discomfort  Problem noted: Filling Interventions (Filling): Frequent nursing;Double electric pump;Massage  Hold (Positioning): Assistance needed to correctly position infant at breast and maintain latch. Intervention(s):  Breastfeeding basics reviewed;Position options;Support Pillows;Skin to skin  LATCH Score: 6  Lactation Tools Discussed/Used Tools: Pump Nipple shield size: 24 Breast pump type: Double-Electric Breast Pump   Consult Status Consult Status: Follow-up Date: 12/20/16 Follow-up type: In-patient    [name], Diamond NickelLAURA G 12/20/2016, 3:19 AM

## 2016-12-20 NOTE — Progress Notes (Signed)
Subjective: Postpartum Day 2: Cesarean Delivery Patient reports incisional pain, tolerating PO and no problems voiding.    Objective: Vital signs in last 24 hours: Temp:  [97.7 F (36.5 C)-98.1 F (36.7 C)] 97.7 F (36.5 C) (02/05 0542) Pulse Rate:  [62-87] 87 (02/05 0542) Resp:  [18] 18 (02/05 0542) BP: (112-123)/(60-70) 112/70 (02/05 0542)  Physical Exam:  General: alert and cooperative Lochia: appropriate Uterine Fundus: firm Incision: healing well DVT Evaluation: No evidence of DVT seen on physical exam. Negative Homan's sign. No cords or calf tenderness. Calf/Ankle edema is present.   Recent Labs  12/17/16 0817 12/18/16 0615  HGB 11.2* 9.1*  HCT 32.1* 25.9*    Assessment/Plan: Status post Cesarean section. Doing well postoperatively.  Continue current care  desires circ prior to discharge.  CURTIS,CAROL G 12/20/2016, 7:52 AM  Agree with above.   Counseled pt for circ including risk of bleeding, infection and scarring.  All questions were answered and the patient wishes to proceed.  Mitchel HonourMegan Kenzel Ruesch, DO

## 2016-12-21 ENCOUNTER — Ambulatory Visit: Payer: Self-pay

## 2016-12-21 MED ORDER — IBUPROFEN 600 MG PO TABS: 600.0000 mg | ORAL_TABLET | Freq: Four times a day (QID) | ORAL | 1 refills | Status: DC | PRN

## 2016-12-21 NOTE — Lactation Note (Addendum)
This note was copied from a baby's chart. Lactation Consultation Note: Mother paged for latch assistance. She wanted to try to feed infant without the nipple shield. Mother has infant in cross cradle hold. Assist mother with proper positioning and hand placement. I had mother to do reverse pressure exercise to firm nipple. Infant latched on with a shallow latch at first. Adjust infants lower jaw and top lip. Mother taught breast compression. Infant was observed with good rhythmic pattern of suckling and swallowing for 20 mins. Mother taught to do breast compression. Infant nursed very well without the nipple shield. Infant very content after feeding.  Mother has EBM sitting at the bedside to give infant . Advised to offer infant 15 - 20 ml ebm after each breast feeding if infant will take.  Explained to parent paced bottle feeding. [facility]  Parents receptive to all teaching.   Advised mother to allow infant to remove as much milk as possible when full. Post pump for 10 mins to soften breast and then ice to reduce swelling. Mother is aware of available Lactation services . Encouraged mother to follow up with LC as needed.   Patient Name: Lauren Schroeder Today's Date: 12/21/2016 Reason for consult: Follow-up assessment   Maternal Data    Feeding Feeding Type: Breast Fed Length of feed: 20 min  LATCH Score/Interventions Latch: Grasps breast easily, tongue down, lips flanged, rhythmical sucking. Intervention(s): Skin to skin;Teach feeding cues;Waking techniques Intervention(s): Breast compression  Audible Swallowing: Spontaneous and intermittent Intervention(s): Hand expression Intervention(s): Hand expression  Type of Nipple: Everted at rest and after stimulation (slight areola edema) Intervention(s): Reverse pressure  Comfort (Breast/Nipple): Filling, red/small blisters or bruises, mild/mod discomfort  Problem noted:  Filling Interventions (Filling): Frequent nursing  Hold (Positioning): Assistance needed to correctly position infant at breast and maintain latch. Intervention(s): Support Pillows;Position options  LATCH Score: 8  Lactation Tools Discussed/Used     Consult Status Consult Status: Complete    Michel BickersKendrick, Seynabou Fults McCoy 12/21/2016, 1:03 PM

## 2016-12-21 NOTE — Lactation Note (Signed)
This note was copied from a baby's chart. Lactation Consultation Note Checked on baby X 2 during the night and parents were sleeping. Concerned baby hadn't had a stool in 48 hrs. Also LC had discussed w/parents to supplement after every BF. Baby did gained a little weight. Noted baby had 2 stools during the night. On coming LC will follow up. Patient Name: Lauren Schroeder Today's Date: 12/21/2016 Reason for consult: Follow-up assessment;Infant weight loss   Maternal Data    Feeding Feeding Type: Breast Fed Length of feed: 20 min  LATCH Score/Interventions                      Lactation Tools Discussed/Used     Consult Status Consult Status: Follow-up Date: 12/21/16 Follow-up type: In-patient    Annalis Kaczmarczyk, Diamond NickelLAURA G 12/21/2016, 7:03 AM

## 2016-12-21 NOTE — Discharge Summary (Signed)
Obstetric Discharge Summary Reason for Admission: onset of labor Prenatal Procedures: ultrasound Intrapartum Procedures: cesarean: low cervical, transverse Postpartum Procedures: none Complications-Operative and Postpartum: none Hemoglobin  Date Value Ref Range Status  12/18/2016 9.1 (L) 12.0 - 15.0 g/dL Final   HCT  Date Value Ref Range Status  12/18/2016 25.9 (L) 36.0 - 46.0 % Final    Physical Exam:  General: alert and cooperative Lochia: appropriate Uterine Fundus: firm Incision: healing well DVT Evaluation: No evidence of DVT seen on physical exam. Negative Homan's sign. No cords or calf tenderness. No significant calf/ankle edema.  Discharge Diagnoses: Term Pregnancy-delivered  Discharge Information: Date: 12/21/2016 Activity: pelvic rest Diet: routine Medications: PNV, Ibuprofen and celexa Condition: stable Instructions: refer to practice specific booklet Discharge to: home   Newborn Data: Live born female  Birth Weight: 7 lb 7.2 oz (3380 g) APGAR: 10, 10  Home with mother.  Lauren Schroeder G 12/21/2016, 8:25 AM

## 2018-11-13 NOTE — H&P (Signed)
37 yo G2P1 found to have a MAB on office U/S 11/10/18 C/W 8 2/7 weeks CRL. No bleeding.  ROS: no fever, no pain  PMH: anxiety  PSH: negative  OBHx: C/S x 1  Meds: citalopram 20mg  po qd            PNV  All: PCN>hives       Valtrex>hives  FHx: heart dx, DM, leukemia, Bipolar disorder, IBS  PE: Lungs CTA        Cor RRR  A/P: MAB         Options of management reviewed>patient elects D&E         A+

## 2018-11-15 NOTE — L&D Delivery Note (Signed)
Delivery Note/VBAC  Pt was in [facility] specialty care and began with strong ctxs, was checked and found to be 8cm.  Was taken to L&D and was complete.  Pushed and delivered.  SVD viable female Apgars 6,8 over 2nd degree MLL.  Placenta delivered spontaneously intact with 3VC. Repair with 2-0 Chromic with good support and hemostasis noted.  R/V exam confirms.  PH art was sent.   Mother and baby to couplet care and are doing well.  EBL 200cc  Louretta Shorten, MD

## 2018-11-16 ENCOUNTER — Ambulatory Visit (HOSPITAL_COMMUNITY): Payer: Commercial Managed Care - PPO | Admitting: Anesthesiology

## 2018-11-16 ENCOUNTER — Encounter (HOSPITAL_COMMUNITY): Payer: Self-pay

## 2018-11-16 ENCOUNTER — Other Ambulatory Visit: Payer: Self-pay

## 2018-11-16 ENCOUNTER — Encounter (HOSPITAL_COMMUNITY)
Admission: RE | Disposition: A | Discharge status: Home / Self Care | Payer: Self-pay | Source: Home / Self Care | Attending: Obstetrics and Gynecology

## 2018-11-16 ENCOUNTER — Ambulatory Visit (HOSPITAL_COMMUNITY)
Admission: RE | Admit: 2018-11-16 | Discharge: 2018-11-16 | Disposition: A | Discharge status: Home / Self Care | Payer: Commercial Managed Care - PPO | Attending: Obstetrics and Gynecology | Admitting: Obstetrics and Gynecology

## 2018-11-16 DIAGNOSIS — Z79899 Other long term (current) drug therapy: Secondary | ICD-10-CM | POA: Insufficient documentation

## 2018-11-16 DIAGNOSIS — F419 Anxiety disorder, unspecified: Secondary | ICD-10-CM | POA: Diagnosis not present

## 2018-11-16 DIAGNOSIS — O021 Missed abortion: Secondary | ICD-10-CM

## 2018-11-16 HISTORY — DX: Major depressive disorder, single episode, unspecified: F32.9

## 2018-11-16 HISTORY — PX: DILATION AND EVACUATION: SHX1459

## 2018-11-16 HISTORY — DX: Depression, unspecified: F32.A

## 2018-11-16 HISTORY — DX: Anxiety disorder, unspecified: F41.9

## 2018-11-16 LAB — CBC
HCT: 39.7 % (ref 36.0–46.0)
Hemoglobin: 13.2 g/dL (ref 12.0–15.0)
MCH: 31.2 pg (ref 26.0–34.0)
MCHC: 33.2 g/dL (ref 30.0–36.0)
MCV: 93.9 fL (ref 80.0–100.0)
Platelets: 303 10*3/uL (ref 150–400)
RBC: 4.23 MIL/uL (ref 3.87–5.11)
RDW: 12.3 % (ref 11.5–15.5)
WBC: 5.7 10*3/uL (ref 4.0–10.5)
nRBC: 0 % (ref 0.0–0.2)

## 2018-11-16 LAB — TYPE AND SCREEN
ABO/RH(D): A POS
Antibody Screen: NEGATIVE

## 2018-11-16 SURGERY — DILATION AND EVACUATION, UTERUS
Anesthesia: Monitor Anesthesia Care | Site: Vagina

## 2018-11-16 MED ORDER — SCOPOLAMINE 1 MG/3DAYS TD PT72
MEDICATED_PATCH | TRANSDERMAL | Status: AC
  Administered 2018-11-16: 1.5 mg via TRANSDERMAL
  Filled 2018-11-16: qty 1

## 2018-11-16 MED ORDER — PROPOFOL 10 MG/ML IV BOLUS
INTRAVENOUS | Status: AC
  Filled 2018-11-16: qty 20

## 2018-11-16 MED ORDER — ONDANSETRON HCL 4 MG/2ML IJ SOLN
INTRAMUSCULAR | Status: AC
  Filled 2018-11-16: qty 2

## 2018-11-16 MED ORDER — LACTATED RINGERS IV SOLN
INTRAVENOUS | Status: DC
  Administered 2018-11-16: 125 mL/h via INTRAVENOUS
  Administered 2018-11-16: 12:00:00 via INTRAVENOUS

## 2018-11-16 MED ORDER — SOD CITRATE-CITRIC ACID 500-334 MG/5ML PO SOLN
30.0000 mL | ORAL | Status: AC
  Administered 2018-11-16: 30 mL via ORAL

## 2018-11-16 MED ORDER — OXYTOCIN 10 UNIT/ML IJ SOLN
INTRAMUSCULAR | Status: DC | PRN
  Administered 2018-11-16: 10 [IU] via INTRAMUSCULAR

## 2018-11-16 MED ORDER — LACTATED RINGERS IV SOLN
INTRAVENOUS | Status: DC
  Administered 2018-11-16: 12:00:00 via INTRAVENOUS

## 2018-11-16 MED ORDER — MIDAZOLAM HCL 2 MG/2ML IJ SOLN
INTRAMUSCULAR | Status: AC
  Filled 2018-11-16: qty 2

## 2018-11-16 MED ORDER — SOD CITRATE-CITRIC ACID 500-334 MG/5ML PO SOLN
ORAL | Status: AC
  Administered 2018-11-16: 30 mL via ORAL
  Filled 2018-11-16: qty 15

## 2018-11-16 MED ORDER — LIDOCAINE HCL (CARDIAC) PF 100 MG/5ML IV SOSY
PREFILLED_SYRINGE | INTRAVENOUS | Status: AC
  Filled 2018-11-16: qty 5

## 2018-11-16 MED ORDER — GENTAMICIN SULFATE 40 MG/ML IJ SOLN
INTRAVENOUS | Status: AC
  Administered 2018-11-16: 340 mg via INTRAVENOUS
  Filled 2018-11-16: qty 8.5

## 2018-11-16 MED ORDER — LIDOCAINE HCL (CARDIAC) PF 100 MG/5ML IV SOSY
PREFILLED_SYRINGE | INTRAVENOUS | Status: DC | PRN
  Administered 2018-11-16: 60 mg via INTRAVENOUS

## 2018-11-16 MED ORDER — PROPOFOL 10 MG/ML IV BOLUS
INTRAVENOUS | Status: DC | PRN
  Administered 2018-11-16: 20 mg via INTRAVENOUS
  Administered 2018-11-16: 50 mg via INTRAVENOUS
  Administered 2018-11-16 (×4): 20 mg via INTRAVENOUS

## 2018-11-16 MED ORDER — ONDANSETRON HCL 4 MG/2ML IJ SOLN
INTRAMUSCULAR | Status: DC | PRN
  Administered 2018-11-16: 4 mg via INTRAVENOUS

## 2018-11-16 MED ORDER — FENTANYL CITRATE (PF) 100 MCG/2ML IJ SOLN
INTRAMUSCULAR | Status: AC
  Filled 2018-11-16: qty 2

## 2018-11-16 MED ORDER — FENTANYL CITRATE (PF) 100 MCG/2ML IJ SOLN: 25.0000 ug | INTRAMUSCULAR | Status: DC | PRN

## 2018-11-16 MED ORDER — MIDAZOLAM HCL 5 MG/5ML IJ SOLN
INTRAMUSCULAR | Status: DC | PRN
  Administered 2018-11-16: 2 mg via INTRAVENOUS

## 2018-11-16 MED ORDER — LIDOCAINE HCL 1 % IJ SOLN
INTRAMUSCULAR | Status: AC
  Filled 2018-11-16: qty 20

## 2018-11-16 MED ORDER — ACETAMINOPHEN ER 650 MG PO TBCR: 650.0000 mg | EXTENDED_RELEASE_TABLET | Freq: Three times a day (TID) | ORAL | 0 refills | Status: DC | PRN

## 2018-11-16 MED ORDER — PROMETHAZINE HCL 25 MG/ML IJ SOLN: 6.2500 mg | INTRAMUSCULAR | Status: DC | PRN

## 2018-11-16 MED ORDER — IBUPROFEN 600 MG PO TABS: 600.0000 mg | ORAL_TABLET | Freq: Four times a day (QID) | ORAL | 0 refills | Status: DC | PRN

## 2018-11-16 MED ORDER — SCOPOLAMINE 1 MG/3DAYS TD PT72
1.0000 | MEDICATED_PATCH | Freq: Once | TRANSDERMAL | Status: DC
  Administered 2018-11-16: 1.5 mg via TRANSDERMAL

## 2018-11-16 MED ORDER — LIDOCAINE HCL 1 % IJ SOLN
INTRAMUSCULAR | Status: DC | PRN
  Administered 2018-11-16: 20 mL

## 2018-11-16 MED ORDER — OXYCODONE HCL 5 MG PO TABS: 5.0000 mg | ORAL_TABLET | Freq: Once | ORAL | Status: DC | PRN

## 2018-11-16 MED ORDER — FENTANYL CITRATE (PF) 100 MCG/2ML IJ SOLN
INTRAMUSCULAR | Status: DC | PRN
  Administered 2018-11-16 (×2): 50 ug via INTRAVENOUS

## 2018-11-16 MED ORDER — OXYCODONE HCL 5 MG/5ML PO SOLN: 5.0000 mg | Freq: Once | ORAL | Status: DC | PRN

## 2018-11-16 SURGICAL SUPPLY — 18 items
CATH ROBINSON RED A/P 16FR (CATHETERS) ×2 IMPLANT
DECANTER SPIKE VIAL GLASS SM (MISCELLANEOUS) ×2 IMPLANT
GLOVE BIO SURGEON STRL SZ7.5 (GLOVE) ×2 IMPLANT
GLOVE BIOGEL PI IND STRL 7.0 (GLOVE) ×1 IMPLANT
GLOVE BIOGEL PI INDICATOR 7.0 (GLOVE) ×1
GOWN STRL REUS W/TWL LRG LVL3 (GOWN DISPOSABLE) ×4 IMPLANT
HIBICLENS CHG 4% 4OZ BTL (MISCELLANEOUS) ×2 IMPLANT
KIT BERKELEY 1ST TRIMESTER 3/8 (MISCELLANEOUS) ×2 IMPLANT
NS IRRIG 1000ML POUR BTL (IV SOLUTION) ×2 IMPLANT
PACK VAGINAL MINOR WOMEN LF (CUSTOM PROCEDURE TRAY) ×2 IMPLANT
PAD OB MATERNITY 4.3X12.25 (PERSONAL CARE ITEMS) ×2 IMPLANT
PAD PREP 24X48 CUFFED NSTRL (MISCELLANEOUS) ×2 IMPLANT
SET BERKELEY SUCTION TUBING (SUCTIONS) ×2 IMPLANT
TOWEL OR 17X24 6PK STRL BLUE (TOWEL DISPOSABLE) ×4 IMPLANT
VACURETTE 10 RIGID CVD (CANNULA) IMPLANT
VACURETTE 7MM CVD STRL WRAP (CANNULA) IMPLANT
VACURETTE 8 RIGID CVD (CANNULA) ×1 IMPLANT
VACURETTE 9 RIGID CVD (CANNULA) IMPLANT

## 2018-11-16 NOTE — Discharge Instructions (Signed)
DISCHARGE INSTRUCTIONS: D&E The following instructions have been prepared to help you care for yourself upon your return home.   Personal hygiene:  Use sanitary pads for vaginal drainage, not tampons.  Shower the day after your procedure.  NO tub baths, pools or Jacuzzis for 2-3 weeks.  Activity and limitations: Get plenty of rest for the remainder of the day. A responsible individual must stay with you for 24 hours following the procedure.  For the next 24 hours, DO NOT: -Drive a car -Advertising copywriter -Drink alcoholic beverages -Take any medication unless instructed by your physician -Make any legal decisions or sign important papers.  Do NOT rest in bed all day.  Walking is encouraged.  Walk up and down stairs slowly.  You may resume your normal activity in one to two days or as indicated by your physician.  Sexual activity: NO intercourse for at least 2 weeks after the procedure, or as indicated by your physician.  Return to work: You may resume your work activities in one to two days or as indicated by your doctor.  What to expect after your surgery: Expect to have vaginal bleeding/discharge for 2-3 days and spotting for up to 10 days. It is not unusual to have soreness for up to 1-2 weeks. You may have a slight burning sensation when you urinate for the first day. Mild cramps may continue for a couple of days. You may have a regular period in 2-6 weeks.  Call your doctor for any of the following:  Excessive vaginal bleeding, saturating and changing one pad every hour.  Inability to urinate 6 hours after discharge from hospital.  Pain not relieved by pain medication.  Fever of 100.4 F or greater.  Unusual vaginal discharge or odor.  Post Anesthesia Home Care Instructions  Activity: Get plenty of rest for the remainder of the day. A responsible individual must stay with you for 24 hours following the procedure.   Meals: Start with liquid foods such as gelatin  or soup. Progress to regular foods as tolerated. Avoid greasy, spicy, heavy foods. If nausea and/or vomiting occur, drink only clear liquids until the nausea and/or vomiting subsides. Call your physician if vomiting continues.  Special Instructions/Symptoms: Your throat may feel dry or sore from the anesthesia or the breathing tube placed in your throat during surgery. If this causes discomfort, gargle with warm salt water. The discomfort should disappear within 24 hours.  If you had a scopolamine patch placed behind your ear for the management of post- operative nausea and/or vomiting:  1. The medication in the patch is effective for 72 hours, after which it should be removed.  Wrap patch in a tissue and discard in the trash. Wash hands thoroughly with soap and water. 2. You may remove the patch earlier than 72 hours if you experience unpleasant side effects which may include dry mouth, dizziness or visual disturbances. 3. Avoid touching the patch. Wash your hands with soap and water after contact with the patch.

## 2018-11-16 NOTE — Progress Notes (Signed)
No changes to H&P per patient history Reviewed with patient procedure-H/S, D&C D/W risks including infection, organ damage, bleeding/transfusion-HIV/Hep, DVT/PE, pneumonia, IU synechiea and secondary infertility Patient states she understands and agrees

## 2018-11-16 NOTE — Brief Op Note (Signed)
11/16/2018  12:16 PM  PATIENT:  Lauren Schroeder  37 y.o. female  PRE-OPERATIVE DIAGNOSIS:  MISSED AB  POST-OPERATIVE DIAGNOSIS:  missed abortion  PROCEDURE:  Procedure(s): DILATATION AND EVACUATION (N/A)  SURGEON:  Surgeon(s) and Role:    Harold Hedge, MD - Primary  PHYSICIAN ASSISTANT:   ASSISTANTS: none   ANESTHESIA:   IV sedation and paracervical block  EBL:  50 mL   BLOOD ADMINISTERED:none  DRAINS: none   LOCAL MEDICATIONS USED:  LIDOCAINE  and Amount: 20 ml  SPECIMEN:  Source of Specimen:  POC  DISPOSITION OF SPECIMEN:  PATHOLOGY  COUNTS:  YES  TOURNIQUET:  * No tourniquets in log *  DICTATION: .Other Dictation: Dictation Number [id number]  PLAN OF CARE: Discharge to home after PACU  PATIENT DISPOSITION:  PACU - hemodynamically stable.   Delay start of Pharmacological VTE agent (>24hrs) due to surgical blood loss or risk of bleeding: not applicable

## 2018-11-16 NOTE — Transfer of Care (Signed)
Immediate Anesthesia Transfer of Care Note  Patient: Lauren Schroeder  Procedure(s) Performed: DILATATION AND EVACUATION (N/A Vagina )  Patient Location: PACU  Anesthesia Type:MAC  Level of Consciousness: awake, alert  and oriented  Airway & Oxygen Therapy: Patient Spontanous Breathing  Post-op Assessment: Report given to RN and Post -op Vital signs reviewed and stable  Post vital signs: Reviewed and stable  Last Vitals:  Vitals Value Taken Time  BP    Temp    Pulse 72 11/16/2018 12:15 PM  Resp    SpO2 93 % 11/16/2018 12:15 PM  Vitals shown include unvalidated device data.  Last Pain:  Vitals:   11/16/18 1032  TempSrc: Oral  PainSc: 0-No pain      Patients Stated Pain Goal: 5 (89/37/34 2876)  Complications: No apparent anesthesia complications

## 2018-11-16 NOTE — Op Note (Signed)
Lauren Schroeder, Lauren Schroeder MEDICAL RECORD [id number] ACCOUNT [id number] DATE OF BIRTH:01/07/1981 FACILITY: [facility] LOCATION: [facility] PHYSICIAN:Ryer Asato Jamal Collin II, MD  OPERATIVE REPORT  DATE OF PROCEDURE:  11/16/2018  PREOPERATIVE DIAGNOSIS:  Missed abortion.  POSTOPERATIVE DIAGNOSIS:  Missed abortion.  PROCEDURE:  Dilation and evacuation.  SURGEON:  Harold Hedge, MD  ANESTHESIA:  IV sedation and paracervical block.  ESTIMATED BLOOD LOSS:  Less than 100 mL.  SPECIMENS:  Products of conception to pathology.  INDICATIONS AND CONSENT:  This patient is a 38 year old patient with missed abortion.  Details are dictated in the history and physical.  Dilation evacuation was discussed preoperatively.  Potential risks and complications were reviewed with the patient  preoperatively including but not limited to infection, uterine perforation, organ damage, bleeding requiring transfusion of blood products with HIV and hepatitis acquisition, DVT, PE, pneumonia, and intrauterine synechiae and secondary infertility.  The  patient states she understands and agrees, and consent was signed on the chart.  DESCRIPTION OF PROCEDURE:  The patient was taken to the operating room where she was identified, placed in the dorsal supine position and IV sedation was given.  She was placed in the dorsal lithotomy position.  She was prepped and bladder was straight  catheterized, and she was draped in a sterile fashion.  Timeout was undertaken.  Examination revealed the uterus to be 8 weeks in size and retroverted.  Bivalve speculum was placed in the vagina, and the anterior cervical lip was injected with 1%  lidocaine and grasped with a single-tooth tenaculum.  Paracervical block was placed at the 2, 4 5, 7, 8 and 10 o'clock positions with approximately 20 mL of the same solution.  The cervix was gently progressively dilated.  A #8 curved suction curette was  placed in the intrauterine cavity, and suction curettage  was carried out for products of conception.  Ten units of Pitocin were added to the remaining 500 mL of IV fluid after the first pass.  A sharp and alternating suction curettage was carried out.   The cavity was clean.  Good hemostasis was noted.  Instruments were removed.  The patient was awakened and taken to recovery room in stable condition.  Blood type is A positive.  LN/NUANCE  D:11/16/2018 T:11/16/2018 JOB:[id number]

## 2018-11-16 NOTE — Anesthesia Preprocedure Evaluation (Addendum)
Anesthesia Evaluation  Patient identified by MRN, date of birth, ID band Patient awake    Reviewed: Allergy & Precautions, NPO status , Patient's Chart, lab work & pertinent test results  History of Anesthesia Complications Negative for: history of anesthetic complications  Airway Mallampati: II  TM Distance: >3 FB Neck ROM: Full    Dental  (+) Dental Advisory Given   Pulmonary neg pulmonary ROS,    breath sounds clear to auscultation       Cardiovascular negative cardio ROS   Rhythm:Regular Rate:Normal     Neuro/Psych negative neurological ROS  negative psych ROS   GI/Hepatic negative GI ROS, Neg liver ROS,   Endo/Other  negative endocrine ROS  Renal/GU negative Renal ROS     Musculoskeletal negative musculoskeletal ROS (+)   Abdominal   Peds  Hematology negative hematology ROS (+)   Anesthesia Other Findings   Reproductive/Obstetrics                            Anesthesia Physical Anesthesia Plan  ASA: I  Anesthesia Plan: MAC   Post-op Pain Management:    Induction: Intravenous  PONV Risk Score and Plan: 2 and Propofol infusion and Treatment may vary due to age or medical condition  Airway Management Planned: Nasal Cannula and Natural Airway  Additional Equipment: None  Intra-op Plan:   Post-operative Plan:   Informed Consent: I have reviewed the patients History and Physical, chart, labs and discussed the procedure including the risks, benefits and alternatives for the proposed anesthesia with the patient or authorized representative who has indicated his/her understanding and acceptance.     Plan Discussed with: CRNA and Anesthesiologist  Anesthesia Plan Comments:        Anesthesia Quick Evaluation

## 2018-11-16 NOTE — Anesthesia Postprocedure Evaluation (Signed)
Anesthesia Post Note  Patient: Lauren Schroeder  Procedure(s) Performed: DILATATION AND EVACUATION (N/A Vagina )     Patient location during evaluation: PACU Anesthesia Type: MAC Level of consciousness: awake and alert Pain management: pain level controlled Vital Signs Assessment: post-procedure vital signs reviewed and stable Respiratory status: spontaneous breathing, nonlabored ventilation and respiratory function stable Cardiovascular status: stable and blood pressure returned to baseline Anesthetic complications: no    Last Vitals:  Vitals:   11/16/18 1310 11/16/18 1345  BP:  (!) 100/52  Pulse: 61 69  Resp: 14 16  Temp:  36.6 C  SpO2: 100% 100%    Last Pain:  Vitals:   11/16/18 1345  TempSrc:   PainSc: 0-No pain   Pain Goal: Patients Stated Pain Goal: 5 (11/16/18 1032)               Audry Pili

## 2018-11-17 ENCOUNTER — Encounter (HOSPITAL_COMMUNITY): Payer: Self-pay | Admitting: Obstetrics and Gynecology

## 2019-07-25 ENCOUNTER — Encounter: Payer: Self-pay | Admitting: Registered"

## 2019-07-25 ENCOUNTER — Other Ambulatory Visit: Payer: Self-pay

## 2019-07-25 ENCOUNTER — Encounter: Payer: BC Managed Care – PPO | Attending: Obstetrics and Gynecology | Admitting: Registered"

## 2019-07-25 DIAGNOSIS — O99810 Abnormal glucose complicating pregnancy: Secondary | ICD-10-CM | POA: Insufficient documentation

## 2019-07-25 NOTE — Progress Notes (Signed)
Patient was seen on 07/25/2019 for Gestational Diabetes self-management class at the [facility]. The following learning objectives were met by the patient during this course:   States the definition of Gestational Diabetes  States why dietary management is important in controlling blood glucose  Describes the effects each nutrient has on blood glucose levels  Demonstrates ability to create a balanced meal plan  Demonstrates carbohydrate counting   States when to check blood glucose levels  Demonstrates proper blood glucose monitoring techniques  States the effect of stress and exercise on blood glucose levels  States the importance of limiting caffeine and abstaining from alcohol and smoking  Blood glucose monitor given: [employer] # [id number] Exp: 09/15/19 Blood glucose reading: 84 mg/dL  Patient instructed to monitor glucose levels: FBS: 60 - <95; 1 hour: <140; 2 hour: <120  Patient received handouts:  Nutrition Diabetes and Pregnancy, including carb counting list  Patient will be seen for follow-up as needed.

## 2019-09-06 ENCOUNTER — Encounter (HOSPITAL_COMMUNITY): Payer: Self-pay | Admitting: *Deleted

## 2019-09-06 ENCOUNTER — Inpatient Hospital Stay (HOSPITAL_COMMUNITY)
Admission: AD | Admit: 2019-09-06 | Discharge: 2019-09-09 | DRG: 807 | Disposition: A | Discharge status: Home / Self Care | Payer: BC Managed Care – PPO | Attending: Obstetrics and Gynecology | Admitting: Obstetrics and Gynecology

## 2019-09-06 ENCOUNTER — Other Ambulatory Visit: Payer: Self-pay

## 2019-09-06 DIAGNOSIS — Z3A35 35 weeks gestation of pregnancy: Secondary | ICD-10-CM

## 2019-09-06 DIAGNOSIS — O42913 Preterm premature rupture of membranes, unspecified as to length of time between rupture and onset of labor, third trimester: Principal | ICD-10-CM | POA: Diagnosis present

## 2019-09-06 DIAGNOSIS — O34219 Maternal care for unspecified type scar from previous cesarean delivery: Secondary | ICD-10-CM | POA: Diagnosis present

## 2019-09-06 DIAGNOSIS — O99824 Streptococcus B carrier state complicating childbirth: Secondary | ICD-10-CM | POA: Diagnosis present

## 2019-09-06 DIAGNOSIS — Z8759 Personal history of other complications of pregnancy, childbirth and the puerperium: Secondary | ICD-10-CM

## 2019-09-06 DIAGNOSIS — Z20828 Contact with and (suspected) exposure to other viral communicable diseases: Secondary | ICD-10-CM | POA: Diagnosis present

## 2019-09-06 DIAGNOSIS — O24420 Gestational diabetes mellitus in childbirth, diet controlled: Secondary | ICD-10-CM | POA: Diagnosis present

## 2019-09-06 HISTORY — DX: Gestational diabetes mellitus in pregnancy, unspecified control: O24.419

## 2019-09-06 HISTORY — DX: Irritable bowel syndrome, unspecified: K58.9

## 2019-09-06 LAB — POCT FERN TEST: POCT Fern Test: POSITIVE

## 2019-09-06 MED ORDER — BETAMETHASONE SOD PHOS & ACET 6 (3-3) MG/ML IJ SUSP
12.0000 mg | Freq: Two times a day (BID) | INTRAMUSCULAR | Status: DC
  Administered 2019-09-07: 12 mg via INTRAMUSCULAR
  Filled 2019-09-06: qty 5

## 2019-09-06 MED ORDER — LACTATED RINGERS IV SOLN
INTRAVENOUS | Status: DC
  Administered 2019-09-07: 02:00:00 via INTRAVENOUS

## 2019-09-06 NOTE — MAU Note (Signed)
Water broke about 2030. Had large gush and has continued to leak small amt of clear fld. No pain. No recent cervical. For repeat c/s 09/30/19 and desires repeat c/s

## 2019-09-07 ENCOUNTER — Encounter (HOSPITAL_COMMUNITY)
Admission: AD | Disposition: A | Discharge status: Home / Self Care | Payer: Self-pay | Source: Home / Self Care | Attending: Obstetrics and Gynecology

## 2019-09-07 ENCOUNTER — Encounter (HOSPITAL_COMMUNITY): Payer: Self-pay | Admitting: *Deleted

## 2019-09-07 DIAGNOSIS — Z3A35 35 weeks gestation of pregnancy: Secondary | ICD-10-CM | POA: Diagnosis not present

## 2019-09-07 DIAGNOSIS — O24420 Gestational diabetes mellitus in childbirth, diet controlled: Secondary | ICD-10-CM | POA: Diagnosis present

## 2019-09-07 DIAGNOSIS — Z8759 Personal history of other complications of pregnancy, childbirth and the puerperium: Secondary | ICD-10-CM

## 2019-09-07 DIAGNOSIS — O42013 Preterm premature rupture of membranes, onset of labor within 24 hours of rupture, third trimester: Secondary | ICD-10-CM | POA: Diagnosis present

## 2019-09-07 DIAGNOSIS — O34219 Maternal care for unspecified type scar from previous cesarean delivery: Secondary | ICD-10-CM | POA: Diagnosis present

## 2019-09-07 DIAGNOSIS — Z20828 Contact with and (suspected) exposure to other viral communicable diseases: Secondary | ICD-10-CM | POA: Diagnosis present

## 2019-09-07 DIAGNOSIS — O99824 Streptococcus B carrier state complicating childbirth: Secondary | ICD-10-CM | POA: Diagnosis present

## 2019-09-07 DIAGNOSIS — O42913 Preterm premature rupture of membranes, unspecified as to length of time between rupture and onset of labor, third trimester: Secondary | ICD-10-CM | POA: Diagnosis present

## 2019-09-07 LAB — CBC
HCT: 31.6 % — ABNORMAL LOW (ref 36.0–46.0)
HCT: 34.2 % — ABNORMAL LOW (ref 36.0–46.0)
Hemoglobin: 11 g/dL — ABNORMAL LOW (ref 12.0–15.0)
Hemoglobin: 11.9 g/dL — ABNORMAL LOW (ref 12.0–15.0)
MCH: 31.5 pg (ref 26.0–34.0)
MCH: 31.9 pg (ref 26.0–34.0)
MCHC: 34.8 g/dL (ref 30.0–36.0)
MCHC: 34.8 g/dL (ref 30.0–36.0)
MCV: 90.5 fL (ref 80.0–100.0)
MCV: 91.6 fL (ref 80.0–100.0)
Platelets: 223 10*3/uL (ref 150–400)
Platelets: 227 10*3/uL (ref 150–400)
RBC: 3.45 MIL/uL — ABNORMAL LOW (ref 3.87–5.11)
RBC: 3.78 MIL/uL — ABNORMAL LOW (ref 3.87–5.11)
RDW: 12.4 % (ref 11.5–15.5)
RDW: 12.6 % (ref 11.5–15.5)
WBC: 13.2 10*3/uL — ABNORMAL HIGH (ref 4.0–10.5)
WBC: 8.8 10*3/uL (ref 4.0–10.5)
nRBC: 0 % (ref 0.0–0.2)
nRBC: 0 % (ref 0.0–0.2)

## 2019-09-07 LAB — SARS CORONAVIRUS 2 BY RT PCR (HOSPITAL ORDER, PERFORMED IN ~~LOC~~ HOSPITAL LAB): SARS Coronavirus 2: NEGATIVE

## 2019-09-07 LAB — TYPE AND SCREEN
ABO/RH(D): A POS
Antibody Screen: NEGATIVE

## 2019-09-07 LAB — RPR: RPR Ser Ql: NONREACTIVE

## 2019-09-07 LAB — ABO/RH: ABO/RH(D): A POS

## 2019-09-07 SURGERY — Surgical Case
Anesthesia: Regional

## 2019-09-07 MED ORDER — CITALOPRAM HYDROBROMIDE 40 MG PO TABS
40.0000 mg | ORAL_TABLET | Freq: Every day | ORAL | Status: DC
  Administered 2019-09-07: 40 mg via ORAL
  Filled 2019-09-07 (×2): qty 1

## 2019-09-07 MED ORDER — OXYTOCIN 40 UNITS IN NORMAL SALINE INFUSION - SIMPLE MED
2.5000 [IU]/h | INTRAVENOUS | Status: DC
  Administered 2019-09-07: 2.5 [IU]/h via INTRAVENOUS
  Filled 2019-09-07: qty 1000

## 2019-09-07 MED ORDER — FLEET ENEMA 7-19 GM/118ML RE ENEM: 1.0000 | ENEMA | RECTAL | Status: DC | PRN

## 2019-09-07 MED ORDER — OXYCODONE-ACETAMINOPHEN 5-325 MG PO TABS: 1.0000 | ORAL_TABLET | ORAL | Status: DC | PRN

## 2019-09-07 MED ORDER — LACTATED RINGERS IV SOLN: 500.0000 mL | INTRAVENOUS | Status: DC | PRN

## 2019-09-07 MED ORDER — ZOLPIDEM TARTRATE 5 MG PO TABS: 5.0000 mg | ORAL_TABLET | Freq: Every evening | ORAL | Status: DC | PRN

## 2019-09-07 MED ORDER — GENTAMICIN SULFATE 40 MG/ML IJ SOLN
5.0000 mg/kg | INTRAVENOUS | Status: DC
  Filled 2019-09-07 (×2): qty 7.25

## 2019-09-07 MED ORDER — OXYTOCIN BOLUS FROM INFUSION
500.0000 mL | Freq: Once | INTRAVENOUS | Status: AC
  Administered 2019-09-07: 500 mL via INTRAVENOUS

## 2019-09-07 MED ORDER — CLINDAMYCIN PHOSPHATE 900 MG/50ML IV SOLN: 900.0000 mg | INTRAVENOUS | Status: DC

## 2019-09-07 MED ORDER — ACETAMINOPHEN 325 MG PO TABS
650.0000 mg | ORAL_TABLET | ORAL | Status: DC | PRN
  Administered 2019-09-07: 650 mg via ORAL
  Filled 2019-09-07: qty 2

## 2019-09-07 MED ORDER — MEASLES, MUMPS & RUBELLA VAC IJ SOLR: 0.5000 mL | Freq: Once | INTRAMUSCULAR | Status: DC

## 2019-09-07 MED ORDER — GENTAMICIN SULFATE 40 MG/ML IJ SOLN
5.0000 mg/kg | INTRAVENOUS | Status: DC
  Filled 2019-09-07: qty 7.25

## 2019-09-07 MED ORDER — OXYCODONE-ACETAMINOPHEN 5-325 MG PO TABS: 2.0000 | ORAL_TABLET | ORAL | Status: DC | PRN

## 2019-09-07 MED ORDER — IBUPROFEN 600 MG PO TABS
600.0000 mg | ORAL_TABLET | Freq: Four times a day (QID) | ORAL | Status: DC
  Administered 2019-09-07 – 2019-09-09 (×8): 600 mg via ORAL
  Filled 2019-09-07 (×8): qty 1

## 2019-09-07 MED ORDER — SIMETHICONE 80 MG PO CHEW: 80.0000 mg | CHEWABLE_TABLET | ORAL | Status: DC | PRN

## 2019-09-07 MED ORDER — ONDANSETRON HCL 4 MG/2ML IJ SOLN: 4.0000 mg | Freq: Four times a day (QID) | INTRAMUSCULAR | Status: DC | PRN

## 2019-09-07 MED ORDER — SENNOSIDES-DOCUSATE SODIUM 8.6-50 MG PO TABS
2.0000 | ORAL_TABLET | ORAL | Status: DC
  Filled 2019-09-07 (×2): qty 2

## 2019-09-07 MED ORDER — NALOXONE HCL 0.4 MG/ML IJ SOLN
INTRAMUSCULAR | Status: AC
  Filled 2019-09-07: qty 1

## 2019-09-07 MED ORDER — WITCH HAZEL-GLYCERIN EX PADS: 1.0000 "application " | MEDICATED_PAD | CUTANEOUS | Status: DC | PRN

## 2019-09-07 MED ORDER — ONDANSETRON HCL 4 MG PO TABS: 4.0000 mg | ORAL_TABLET | ORAL | Status: DC | PRN

## 2019-09-07 MED ORDER — CITALOPRAM HYDROBROMIDE 20 MG PO TABS
40.0000 mg | ORAL_TABLET | ORAL | Status: DC
  Administered 2019-09-07 – 2019-09-08 (×2): 40 mg via ORAL
  Filled 2019-09-07 (×2): qty 2

## 2019-09-07 MED ORDER — FENTANYL CITRATE (PF) 100 MCG/2ML IJ SOLN
100.0000 ug | INTRAMUSCULAR | Status: DC | PRN
  Administered 2019-09-07: 100 ug via INTRAVENOUS
  Filled 2019-09-07: qty 2

## 2019-09-07 MED ORDER — BENZOCAINE-MENTHOL 20-0.5 % EX AERO
1.0000 "application " | INHALATION_SPRAY | CUTANEOUS | Status: DC | PRN
  Administered 2019-09-07: 1 via TOPICAL
  Filled 2019-09-07: qty 56

## 2019-09-07 MED ORDER — PRENATAL MULTIVITAMIN CH
1.0000 | ORAL_TABLET | Freq: Every day | ORAL | Status: DC
  Administered 2019-09-07 – 2019-09-08 (×2): 1 via ORAL
  Filled 2019-09-07 (×2): qty 1

## 2019-09-07 MED ORDER — DIPHENHYDRAMINE HCL 25 MG PO CAPS: 25.0000 mg | ORAL_CAPSULE | Freq: Four times a day (QID) | ORAL | Status: DC | PRN

## 2019-09-07 MED ORDER — MEDROXYPROGESTERONE ACETATE 150 MG/ML IM SUSP: 150.0000 mg | INTRAMUSCULAR | Status: DC | PRN

## 2019-09-07 MED ORDER — SOD CITRATE-CITRIC ACID 500-334 MG/5ML PO SOLN: 30.0000 mL | ORAL | Status: DC | PRN

## 2019-09-07 MED ORDER — ONDANSETRON HCL 4 MG/2ML IJ SOLN: 4.0000 mg | INTRAMUSCULAR | Status: DC | PRN

## 2019-09-07 MED ORDER — LIDOCAINE HCL (PF) 1 % IJ SOLN
30.0000 mL | INTRAMUSCULAR | Status: AC | PRN
  Administered 2019-09-07: 30 mL via SUBCUTANEOUS
  Filled 2019-09-07: qty 30

## 2019-09-07 MED ORDER — COCONUT OIL OIL
1.0000 "application " | TOPICAL_OIL | Status: DC | PRN
  Administered 2019-09-07: 1 via TOPICAL

## 2019-09-07 MED ORDER — ACETAMINOPHEN 325 MG PO TABS: 650.0000 mg | ORAL_TABLET | ORAL | Status: DC | PRN

## 2019-09-07 MED ORDER — TETANUS-DIPHTH-ACELL PERTUSSIS 5-2.5-18.5 LF-MCG/0.5 IM SUSP: 0.5000 mL | Freq: Once | INTRAMUSCULAR | Status: DC

## 2019-09-07 MED ORDER — DIBUCAINE (PERIANAL) 1 % EX OINT: 1.0000 "application " | TOPICAL_OINTMENT | CUTANEOUS | Status: DC | PRN

## 2019-09-07 NOTE — Lactation Note (Signed)
This note was copied from a baby's chart. Lactation Consultation Note  Patient Name: Lauren Schroeder Date: 09/07/2019 Reason for consult: Initial assessment;Infant < 6lbs  P2 mother whose infant is now 30 hours old. This is a LPTI at 35+5 weeks weighing < 6 lbs.  Mother breast fed her first child (now 37 1/36 years old) for 6 months.    Baby was asleep in the bassinet when I arrived.  RN had set up the DEBP and mother had no questions about the pump.  Reviewed the LPTI policy in detail with mother and focused on the feeding supplementation guidelines.  Mother verbalized understanding.  Reviewed hand expression and mother was able to return demonstrate.  She was able to express colostrum drops from both breasts which made her very happy.  Colostrum container provided and milk storage times reviewed.  Finger feeding demonstrated.  Discussed the expectations of the LPTI as they relate to breast feeding.  Mother aware that feeding cues may not be displayed and baby may not even latch to the breast.  Asked her to call her RN/LC for assistance or questions with latching as needed.  She will spend no more than 5-10 minutes attempting to latch and continue with lots of STS.    Mom made aware of O/P services, breastfeeding support groups, community resources, and our phone # for post-discharge questions.  Mother has a DEBP for home use but plans to obtain a new pump from her insurance company.  I suggested she call today to begin the process of eligibility and shipping.  Mother will do this and inform them the baby has been delivered.  Father present and sleeping.  Placed a quiet sign on mother's door and updated RN.   Maternal Data Formula Feeding for Exclusion: No Has patient been taught Hand Expression?: Yes Does the patient have breastfeeding experience prior to this delivery?: Yes  Feeding Feeding Type: Formula Nipple Type: Nfant Slow Flow (purple)  LATCH Score                    Interventions    Lactation Tools Discussed/Used Pump Review: Setup, frequency, and cleaning;Milk Storage(Reviewed) Initiated by:: Paul Dykes Date initiated:: 09/07/19   Consult Status Consult Status: Follow-up Date: 09/08/19 Follow-up type: In-patient    Maahir Horst R Wilmetta Speiser 09/07/2019, 1:38 PM

## 2019-09-07 NOTE — H&P (Signed)
Lauren Schroeder is a 38 y.o. female presenting for PROM clear fluid 2030.  Pregnancy complicated by AMA with normal NIPS.  GBS+ and previous c/s desires repeat.  Also, A1DM with good control.  Not in labor. OB History    Gravida  2   Para  1   Term  1   Preterm  0   AB  0   Living  1     SAB  0   TAB  0   Ectopic  0   Multiple  0   Live Births  1          Past Medical History:  Diagnosis Date  . Anxiety    mild  . Depression    mild  . Gestational diabetes   . IBS (irritable bowel syndrome)   . Medical history non-contributory    Past Surgical History:  Procedure Laterality Date  . CESAREAN SECTION N/A 12/18/2016   Procedure: CESAREAN SECTION;  Surgeon: Lauren Pearson, MD;  Location: [facility];  Service: Obstetrics;  Laterality: N/A;  . DILATION AND EVACUATION N/A 11/16/2018   Procedure: DILATATION AND EVACUATION;  Surgeon: Lauren Farrier, MD;  Location: [facility];  Service: Gynecology;  Laterality: N/A;   Family History: family history is not on file. Social History:  reports that she has never smoked. She has never used smokeless tobacco. She reports previous alcohol use. She reports that she does not use drugs.     Maternal Diabetes: Yes:  Diabetes Type:  Diet controlled Genetic Screening: Normal Maternal Ultrasounds/Referrals: Normal Fetal Ultrasounds or other Referrals fetal renal pylectasis Maternal Substance Abuse:  No Significant Maternal Medications:  None Significant Maternal Lab Results:  Group B Strep positive Other Comments:  None  ROS History   Blood pressure 110/61, pulse 82, temperature 98.7 F (37.1 C), temperature source Oral, resp. rate 16, height 5\' 1"  (1.549 m), weight 73.5 kg, SpO2 99 %, unknown if currently breastfeeding. Exam Physical Exam  Prenatal labs: ABO, Rh: --/--/PENDING (10/22 2345) Antibody: PENDING (10/22 2345) Rubella:   RPR:    HBsAg:    HIV:    GBS:     Assessment/Plan: IUP at 35 4/7 PPROM - not in  labor. A1 DM - great glc control Previous C/S for Repeat Last ate 2 hours ago and not in labor.  L&D has 3 c/s in queue currently so they want to schedule it and she is scheduled to follow the 11 am c/s so after noon. Plan BMZ now, and repeat at noon.  If labors or fetal monitoring not reassuring, will go to c/s immediately NPO after 6 am. Will start clindamycin for GBS + noted in urine this pregnancy  Lauren Schroeder 09/07/2019, 12:07 AM

## 2019-09-07 NOTE — Progress Notes (Signed)
MOB was referred for history of depression/anxiety.  * Referral screened out by Clinical Social Worker because none of the following criteria appear to apply:  ~ History of anxiety/depression during this pregnancy, or of post-partum depression following prior delivery. ~ Diagnosis of anxiety and/or depression within last 3 years. Per records, MOB's anxiety dates back to 2017.  OR * MOB's symptoms currently being treated with medication and/or therapy.  Please contact the Clinical Social Worker if needs arise, by MOB request, or if MOB scores greater than 9/yes to question 10 on Edinburgh Postpartum Depression Screen.  Zayn Selley, LCSWA  [facility] [phone]  

## 2019-09-07 NOTE — MAU Note (Signed)
Covid swab obtained without difficulty and pt tol well. No symptoms 

## 2019-09-07 NOTE — Progress Notes (Signed)
Patient doing very well. BP 105/62 (BP Location: Right Arm)   Pulse 79   Temp 98.5 F (36.9 C) (Oral)   Resp 18   Ht 5\' 1"  (1.549 m)   Wt 73.5 kg   SpO2 99%   Breastfeeding Unknown   BMI 30.61 kg/m  Abdomen is soft and non tender Lochia WNL  PPD # 0 VBAC Doing well circ tomorrow Routine care

## 2019-09-08 NOTE — Plan of Care (Signed)
  Problem: Safety: Goal: Ability to remain free from injury will improve Outcome: Completed/Met   Problem: Skin Integrity: Goal: Risk for impaired skin integrity will decrease Outcome: Completed/Met   Problem: Education: Goal: Knowledge of disease or condition will improve Outcome: Completed/Met Goal: Knowledge of the prescribed therapeutic regimen will improve Outcome: Completed/Met   Problem: Clinical Measurements: Goal: Complications related to the disease process, condition or treatment will be avoided or minimized Outcome: Completed/Met   Problem: Education: Goal: Knowledge of condition will improve Outcome: Completed/Met   Problem: Activity: Goal: Will verbalize the importance of balancing activity with adequate rest periods Outcome: Completed/Met Goal: Ability to tolerate increased activity will improve Outcome: Completed/Met   Problem: Coping: Goal: Ability to identify and utilize available resources and services will improve Outcome: Completed/Met   Problem: Life Cycle: Goal: Chance of risk for complications during the postpartum period will decrease Outcome: Completed/Met   Problem: Role Relationship: Goal: Ability to demonstrate positive interaction with newborn will improve Outcome: Completed/Met   Problem: Skin Integrity: Goal: Demonstration of wound healing without infection will improve Outcome: Completed/Met

## 2019-09-08 NOTE — Progress Notes (Signed)
Patient doing well. BP 98/62   Pulse 73   Temp 97.9 F (36.6 C) (Oral)   Resp 18   Ht 5\' 1"  (1.549 m)   Wt 73.5 kg   SpO2 98%   Breastfeeding Unknown   BMI 30.61 kg/m  No results found for this or any previous visit (from the past 24 hour(s)). Lochia   PPD # 1  VBAC Doing well circ today  DIscharge home tomorrow

## 2019-09-08 NOTE — Lactation Note (Signed)
This note was copied from a baby's chart. Lactation Consultation Note  Patient Name: Lauren Schroeder Date: 09/08/2019 Reason for consult: Follow-up assessment;Maternal endocrine disorder;Late-preterm 34-36.6wks;Other (Comment);Infant < 6lbs;Infant weight loss(AMA) Type of Endocrine Disorder?: Diabetes(GDM diet)  7 hours old LPI female who is being partially BF and formula fed by his mother, she's a P2. Mom has been pumping every 4 hours, she got about 20 ml on her last pumping session, praised her for her efforts. Remind mom the importance of consistent pumping to protect her supply even after she gets home because these babies < 6 lbs are not likely to fully empty the breast upon discharge.   Mom has a DEBP at home and feels hopeful that eventually baby will catch up developmentally, he's still "biting" and chewing on her nipple, even on the bottle nipples, baby currently on NICU Nfant purple nipple. Mom wanted to work on widen baby's latch when he feeds at the breast/bottle, showed for some suck training exercises while baby was asleep on her chest, LC dipped gloved finger on EBM and noticed that baby's suck was uncoordinated, he had tight grip. He briefly work up and went back to sleep.  Offered assistance with latch but mom politely declined, baby just fed and he was sound asleep. Asked mom to call for assistance when needed. Reviewed LPI policy, normal LPI behavior, cluster feeding, feeding cues, breastmilk storage guidelines and supplementation guidelines when feeding baby at the breast, mom understands that her EBM will always be our first choice for supplementation and that she'll be using Similac 22 calorie formula only to complete the volumes required per LPI policy according to baby's age in hours.  Feeding plan  1. Encouraged mom to keep baby to breast on cues STS 8-12 times/24 hours, but not to exceed feeding attempts to more than 20-30 minutes at a time 2. She'll try to pump  every 3 hours, at least 8 pumping sessions in 24 hours, will use coconut oil prior pumping 3. She'll continue supplementing baby every 3 hours or sooner if feeding cues are present  Parents reported all questions and concerns were answered, they're both aware of [facility] OP services and will call PRN.  Maternal Data    Feeding Feeding Type: Bottle Fed - Formula  LATCH Score                   Interventions Interventions: Breast feeding basics reviewed;Expressed milk;DEBP  Lactation Tools Discussed/Used Tools: Pump;Coconut oil Breast pump type: Double-Electric Breast Pump   Consult Status Consult Status: Follow-up Date: 09/09/19 Follow-up type: In-patient    Lauren Schroeder 09/08/2019, 10:18 PM

## 2019-09-09 NOTE — Lactation Note (Signed)
This note was copied from a baby's chart. Lactation Consultation Note  Patient Name: Lauren Schroeder Date: 09/09/2019 Reason for consult: Follow-up assessment;Late-preterm 34-36.6wks;Infant < 6lbs Baby is 54 hours old/6% weight loss.  Mom states baby isn't latching but bottle feeding well.  She is pumping every 3 hours and recently obtained 20 mls from each breast.  Discussed milk coming to volume and the prevention and treatment of engorgement.  She does have a breast pump at home.  Breasts are comfortable.  Encouraged skin to skin and nuzzling at breast.  Recommended and outpatient appointment in 1-2 weeks.  Reviewed lactation services and phone numbers.  Maternal Data    Feeding Feeding Type: Bottle Fed - Formula Nipple Type: Nfant Slow Flow (purple)  LATCH Score                   Interventions    Lactation Tools Discussed/Used     Consult Status Consult Status: Complete Follow-up type: Call as needed    Ave Filter 09/09/2019, 9:33 AM

## 2019-09-09 NOTE — Discharge Summary (Signed)
Obstetric Discharge Summary Reason for Admission: rupture of membranes Prenatal Procedures: none Intrapartum Procedures: VBAC Postpartum Procedures: none Complications-Operative and Postpartum: 2nd degree perineal laceration Hemoglobin  Date Value Ref Range Status  09/07/2019 11.0 (L) 12.0 - 15.0 g/dL Final   HCT  Date Value Ref Range Status  09/07/2019 31.6 (L) 36.0 - 46.0 % Final    Physical Exam:  General: alert, cooperative and appears stated age 38: appropriate Uterine Fundus: firm Incision: healing well, no significant drainage, no dehiscence, no significant erythema DVT Evaluation: No evidence of DVT seen on physical exam.  Discharge Diagnoses: Term Pregnancy-delivered  Discharge Information: Date: 09/09/2019 Activity: pelvic rest Diet: routine Medications: PNV Condition: improved Instructions: refer to practice specific booklet Discharge to: home   Newborn Data: Live born female  Birth Weight: 5 lb 6.4 oz (2450 g) APGAR: 6, 8  Newborn Delivery   Birth date/time: 09[birth year]/2019 03:22:00 Delivery type: VBAC, Spontaneous      Home with mother.  Lauren Schroeder 09/09/2019, 8:32 AM

## 2019-09-30 ENCOUNTER — Inpatient Hospital Stay (HOSPITAL_COMMUNITY): Admit: 2019-09-30 | Payer: BC Managed Care – PPO | Admitting: Obstetrics & Gynecology

## 2019-09-30 ENCOUNTER — Encounter (HOSPITAL_COMMUNITY): Payer: Self-pay

## 2019-09-30 SURGERY — Surgical Case
Anesthesia: Regional

## 2020-08-18 DIAGNOSIS — Z713 Dietary counseling and surveillance: Secondary | ICD-10-CM | POA: Diagnosis not present

## 2020-08-25 DIAGNOSIS — Z713 Dietary counseling and surveillance: Secondary | ICD-10-CM | POA: Diagnosis not present

## 2020-08-26 DIAGNOSIS — Z03818 Encounter for observation for suspected exposure to other biological agents ruled out: Secondary | ICD-10-CM | POA: Diagnosis not present

## 2020-08-26 DIAGNOSIS — Z20822 Contact with and (suspected) exposure to covid-19: Secondary | ICD-10-CM | POA: Diagnosis not present

## 2020-08-27 DIAGNOSIS — Z20822 Contact with and (suspected) exposure to covid-19: Secondary | ICD-10-CM | POA: Diagnosis not present

## 2020-09-17 DIAGNOSIS — Z713 Dietary counseling and surveillance: Secondary | ICD-10-CM | POA: Diagnosis not present

## 2020-10-19 DIAGNOSIS — R52 Pain, unspecified: Secondary | ICD-10-CM | POA: Diagnosis not present

## 2020-10-19 DIAGNOSIS — R6883 Chills (without fever): Secondary | ICD-10-CM | POA: Diagnosis not present

## 2020-10-19 DIAGNOSIS — J029 Acute pharyngitis, unspecified: Secondary | ICD-10-CM | POA: Diagnosis not present

## 2020-12-12 DIAGNOSIS — Z01419 Encounter for gynecological examination (general) (routine) without abnormal findings: Secondary | ICD-10-CM | POA: Diagnosis not present

## 2020-12-12 DIAGNOSIS — Z6825 Body mass index (BMI) 25.0-25.9, adult: Secondary | ICD-10-CM | POA: Diagnosis not present

## 2021-02-05 DIAGNOSIS — Z20822 Contact with and (suspected) exposure to covid-19: Secondary | ICD-10-CM | POA: Diagnosis not present

## 2021-02-10 DIAGNOSIS — Z20822 Contact with and (suspected) exposure to covid-19: Secondary | ICD-10-CM | POA: Diagnosis not present

## 2021-07-15 DIAGNOSIS — N911 Secondary amenorrhea: Secondary | ICD-10-CM | POA: Diagnosis not present

## 2021-07-29 DIAGNOSIS — Z3685 Encounter for antenatal screening for Streptococcus B: Secondary | ICD-10-CM | POA: Diagnosis not present

## 2021-07-29 DIAGNOSIS — Z3481 Encounter for supervision of other normal pregnancy, first trimester: Secondary | ICD-10-CM | POA: Diagnosis not present

## 2021-07-29 LAB — OB RESULTS CONSOLE RUBELLA ANTIBODY, IGM: Rubella: IMMUNE

## 2021-07-29 LAB — OB RESULTS CONSOLE ABO/RH: RH Type: POSITIVE

## 2021-07-29 LAB — OB RESULTS CONSOLE GC/CHLAMYDIA
Chlamydia: NEGATIVE
Gonorrhea: NEGATIVE

## 2021-07-29 LAB — OB RESULTS CONSOLE HIV ANTIBODY (ROUTINE TESTING): HIV: NONREACTIVE

## 2021-07-29 LAB — OB RESULTS CONSOLE HEPATITIS B SURFACE ANTIGEN: Hepatitis B Surface Ag: NEGATIVE

## 2021-07-29 LAB — OB RESULTS CONSOLE RPR: RPR: NONREACTIVE

## 2021-07-29 LAB — HEPATITIS C ANTIBODY: HCV Ab: NEGATIVE

## 2021-07-29 LAB — OB RESULTS CONSOLE ANTIBODY SCREEN: Antibody Screen: NEGATIVE

## 2021-08-11 DIAGNOSIS — Z3481 Encounter for supervision of other normal pregnancy, first trimester: Secondary | ICD-10-CM | POA: Diagnosis not present

## 2021-08-11 DIAGNOSIS — N760 Acute vaginitis: Secondary | ICD-10-CM | POA: Diagnosis not present

## 2021-08-11 DIAGNOSIS — Z113 Encounter for screening for infections with a predominantly sexual mode of transmission: Secondary | ICD-10-CM | POA: Diagnosis not present

## 2021-08-11 DIAGNOSIS — O26841 Uterine size-date discrepancy, first trimester: Secondary | ICD-10-CM | POA: Diagnosis not present

## 2021-08-11 DIAGNOSIS — Z124 Encounter for screening for malignant neoplasm of cervix: Secondary | ICD-10-CM | POA: Diagnosis not present

## 2021-08-11 DIAGNOSIS — Z3A10 10 weeks gestation of pregnancy: Secondary | ICD-10-CM | POA: Diagnosis not present

## 2021-08-27 DIAGNOSIS — O09529 Supervision of elderly multigravida, unspecified trimester: Secondary | ICD-10-CM | POA: Diagnosis not present

## 2021-08-27 DIAGNOSIS — Z3A12 12 weeks gestation of pregnancy: Secondary | ICD-10-CM | POA: Diagnosis not present

## 2021-08-27 DIAGNOSIS — Z3682 Encounter for antenatal screening for nuchal translucency: Secondary | ICD-10-CM | POA: Diagnosis not present

## 2021-10-22 DIAGNOSIS — Z363 Encounter for antenatal screening for malformations: Secondary | ICD-10-CM | POA: Diagnosis not present

## 2021-10-22 DIAGNOSIS — Z3A20 20 weeks gestation of pregnancy: Secondary | ICD-10-CM | POA: Diagnosis not present

## 2021-10-22 DIAGNOSIS — Z361 Encounter for antenatal screening for raised alphafetoprotein level: Secondary | ICD-10-CM | POA: Diagnosis not present

## 2021-11-05 DIAGNOSIS — Z20822 Contact with and (suspected) exposure to covid-19: Secondary | ICD-10-CM | POA: Diagnosis not present

## 2021-11-10 DIAGNOSIS — N760 Acute vaginitis: Secondary | ICD-10-CM | POA: Diagnosis not present

## 2021-12-08 DIAGNOSIS — N760 Acute vaginitis: Secondary | ICD-10-CM | POA: Diagnosis not present

## 2021-12-08 DIAGNOSIS — Z3480 Encounter for supervision of other normal pregnancy, unspecified trimester: Secondary | ICD-10-CM | POA: Diagnosis not present

## 2021-12-14 DIAGNOSIS — O99810 Abnormal glucose complicating pregnancy: Secondary | ICD-10-CM | POA: Diagnosis not present

## 2022-01-24 ENCOUNTER — Inpatient Hospital Stay (HOSPITAL_COMMUNITY)
Admission: AD | Admit: 2022-01-24 | Discharge: 2022-01-25 | Disposition: A | Discharge status: Home / Self Care | Payer: BC Managed Care – PPO | Attending: Obstetrics and Gynecology | Admitting: Obstetrics and Gynecology

## 2022-01-24 ENCOUNTER — Other Ambulatory Visit: Payer: Self-pay

## 2022-01-24 ENCOUNTER — Inpatient Hospital Stay (HOSPITAL_COMMUNITY): Payer: BC Managed Care – PPO

## 2022-01-24 ENCOUNTER — Encounter (HOSPITAL_COMMUNITY): Payer: Self-pay | Admitting: Obstetrics and Gynecology

## 2022-01-24 DIAGNOSIS — Z3A34 34 weeks gestation of pregnancy: Secondary | ICD-10-CM | POA: Diagnosis not present

## 2022-01-24 DIAGNOSIS — O99891 Other specified diseases and conditions complicating pregnancy: Secondary | ICD-10-CM | POA: Insufficient documentation

## 2022-01-24 DIAGNOSIS — K449 Diaphragmatic hernia without obstruction or gangrene: Secondary | ICD-10-CM | POA: Diagnosis not present

## 2022-01-24 DIAGNOSIS — N132 Hydronephrosis with renal and ureteral calculous obstruction: Secondary | ICD-10-CM | POA: Diagnosis not present

## 2022-01-24 DIAGNOSIS — N200 Calculus of kidney: Secondary | ICD-10-CM

## 2022-01-24 DIAGNOSIS — R109 Unspecified abdominal pain: Secondary | ICD-10-CM | POA: Insufficient documentation

## 2022-01-24 DIAGNOSIS — O26893 Other specified pregnancy related conditions, third trimester: Secondary | ICD-10-CM | POA: Diagnosis not present

## 2022-01-24 DIAGNOSIS — M549 Dorsalgia, unspecified: Secondary | ICD-10-CM | POA: Diagnosis not present

## 2022-01-24 DIAGNOSIS — O09523 Supervision of elderly multigravida, third trimester: Secondary | ICD-10-CM | POA: Diagnosis not present

## 2022-01-24 DIAGNOSIS — N1330 Unspecified hydronephrosis: Secondary | ICD-10-CM | POA: Diagnosis not present

## 2022-01-24 LAB — URINALYSIS, ROUTINE W REFLEX MICROSCOPIC
Bilirubin Urine: NEGATIVE
Glucose, UA: NEGATIVE mg/dL
Ketones, ur: NEGATIVE mg/dL
Leukocytes,Ua: NEGATIVE
Nitrite: NEGATIVE
Protein, ur: NEGATIVE mg/dL
RBC / HPF: >50 RBC/hpf — ABNORMAL HIGH (ref 0–5)
Specific Gravity, Urine: 1.009 (ref 1.005–1.030)
pH: 7 (ref 5.0–8.0)

## 2022-01-24 MED ORDER — ONDANSETRON HCL 4 MG/2ML IJ SOLN
4.0000 mg | Freq: Once | INTRAMUSCULAR | Status: AC
  Administered 2022-01-24: 4 mg via INTRAVENOUS
  Filled 2022-01-24: qty 2

## 2022-01-24 MED ORDER — MORPHINE SULFATE (PF) 4 MG/ML IV SOLN
4.0000 mg | Freq: Once | INTRAVENOUS | Status: AC
  Administered 2022-01-24: 4 mg via INTRAVENOUS
  Filled 2022-01-24: qty 1

## 2022-01-24 MED ORDER — LACTATED RINGERS IV BOLUS
1000.0000 mL | Freq: Once | INTRAVENOUS | Status: AC
  Administered 2022-01-24: 1000 mL via INTRAVENOUS

## 2022-01-24 MED ORDER — LACTATED RINGERS IV SOLN
INTRAVENOUS | Status: DC
Start: 2022-01-24 — End: 2022-01-25

## 2022-01-24 MED ORDER — HYDROMORPHONE HCL 1 MG/ML IJ SOLN
1.0000 mg | Freq: Once | INTRAMUSCULAR | Status: AC
  Administered 2022-01-24: 1 mg via INTRAVENOUS
  Filled 2022-01-24: qty 1

## 2022-01-24 NOTE — MAU Note (Signed)
Presents with left sided lower to mid low back pain. Pain started yesterday around 1500. ?

## 2022-01-24 NOTE — MAU Note (Signed)
Lauren Schroeder is a 41 y.o. at [redacted]w[redacted]d here in MAU reporting: having a lot of back pain. Constant.  Centralized on left side.  Tried some exercises, different positions.  Nothing is helping.  Getting sharper. Has been peeing - not painful. No fever. No bleeding.  ? ?Onset of complaint: Saturday afternoon ?Pain score: 6 ?Vitals:  ? 01/24/22 1724  ?BP: 99/64  ?Pulse: 93  ?Resp: 18  ?Temp: 98.3 ?F (36.8 ?C)  ?SpO2: 100%  ?   ?FHT:148 ?Lab orders placed from triage:  ua ?

## 2022-01-24 NOTE — MAU Provider Note (Addendum)
History     CSN: [id number]  Arrival date and time: 01/24/22 1635   Event Date/Time   First Provider Initiated Contact with Patient 01/24/22 1822      Chief Complaint  Patient presents with   Back Pain   Lauren Schroeder is a 41 y.o. [id number] at [redacted]w[redacted]d who receives care at [facility].  She presents today for Back Pain. She she states the pain started at 3 PM and has been constant, but was similar to cramping.  She states now the pain is more like a "centralized jabbing" that is worsened with deep breaths or certain movements.  She rates the pain a 6/10 and reports she took Tylenol at 4 PM with no change.  She endorses fetal movement and denies perception of contractions.  She also denies vaginal concerns including bleeding, discharge, and leaking.  No pain or discomfort with urination, but she goes on to report that she feels like she has frequent urination with minimal output. Patient also reports pelvic pressure that has been "consistent for a while."    OB History     Gravida  3   Para  2   Term  1   Preterm  1   AB  0   Living  2      SAB  0   IAB  0   Ectopic  0   Multiple  0   Live Births  2           Past Medical History:  Diagnosis Date   Anxiety    mild   Depression    mild   Gestational diabetes    IBS (irritable bowel syndrome)    Medical history non-contributory     Past Surgical History:  Procedure Laterality Date   CESAREAN SECTION N/A 12/18/2016   Procedure: CESAREAN SECTION;  Surgeon: Zelphia Cairo, MD;  Location: [facility] BIRTHING SUITES;  Service: Obstetrics;  Laterality: N/A;   DILATION AND EVACUATION N/A 11/16/2018   Procedure: DILATATION AND EVACUATION;  Surgeon: Harold Hedge, MD;  Location: [facility] ORS;  Service: Gynecology;  Laterality: N/A;    History reviewed. No pertinent family history.  Social History   Tobacco Use   Smoking status: Never   Smokeless tobacco: Never  Vaping Use   Vaping Use: Never used  Substance Use  Topics   Alcohol use: Not Currently   Drug use: No    Allergies:  Allergies  Allergen Reactions   Amoxicillin Hives    Has patient had a PCN reaction causing immediate rash, facial/tongue/throat swelling, SOB or lightheadedness with hypotension: unknown Has patient had a PCN reaction causing severe rash involving mucus membranes or skin necrosis: unknown Has patient had a PCN reaction that required hospitalization No Has patient had a PCN reaction occurring within the last 10 years: No If all of the above answers are "NO", then may proceed with Cephalosporin use.    Lactose Intolerance (Gi) Other (See Comments)    GI Upset    No medications prior to admission.    Review of Systems  Constitutional:  Negative for chills and fever.  Respiratory:  Negative for cough and shortness of breath.   Gastrointestinal:  Negative for constipation, diarrhea, nausea and vomiting.  Genitourinary:  Positive for frequency (Every 20 minutes with minimal output) and pelvic pain (Pressure). Negative for difficulty urinating, dysuria, vaginal bleeding and vaginal discharge.  Musculoskeletal:  Positive for back pain.  Neurological:  Negative for dizziness, light-headedness and headaches.  Physical Exam  Blood pressure (!) 104/59, pulse 94, temperature 98.2 F (36.8 C), temperature source Oral, resp. rate 20, height 5\' 1"  (1.549 m), weight 70.9 kg, SpO2 100 %, unknown if currently breastfeeding.  Physical Exam Vitals reviewed. Exam conducted with a chaperone present.  Constitutional:      Appearance: Normal appearance.  HENT:     Head: Normocephalic and atraumatic.  Eyes:     Conjunctiva/sclera: Conjunctivae normal.  Cardiovascular:     Rate and Rhythm: Normal rate and regular rhythm.  Abdominal:     Palpations: Abdomen is soft.     Tenderness: There is no abdominal tenderness.  Genitourinary:    General: Normal vulva.     Comments: -1cm/50/Ballotable Vertex Musculoskeletal:         General: Normal range of motion.     Cervical back: Normal range of motion.  Skin:    General: Skin is warm and dry.  Neurological:     Mental Status: She is alert and oriented to person, place, and time.  Psychiatric:        Mood and Affect: Mood normal.        Behavior: Behavior normal.        Thought Content: Thought content normal.    Fetal Assessment 145 bpm, Mod Var, -Decels, +Accels Toco: Ctx Q 5-6 min  MAU Course   Results for orders placed or performed during the hospital encounter of 01/24/22 (from the past 24 hour(s))  Urinalysis, Routine w reflex microscopic Urine, Clean Catch     Status: Abnormal   Collection Time: 01/24/22  5:28 PM  Result Value Ref Range   Color, Urine YELLOW YELLOW   APPearance CLEAR CLEAR   Specific Gravity, Urine 1.009 1.005 - 1.030   pH 7.0 5.0 - 8.0   Glucose, UA NEGATIVE NEGATIVE mg/dL   Hgb urine dipstick LARGE (A) NEGATIVE   Bilirubin Urine NEGATIVE NEGATIVE   Ketones, ur NEGATIVE NEGATIVE mg/dL   Protein, ur NEGATIVE NEGATIVE mg/dL   Nitrite NEGATIVE NEGATIVE   Leukocytes,Ua NEGATIVE NEGATIVE   RBC / HPF >50 (H) 0 - 5 RBC/hpf   WBC, UA 0-5 0 - 5 WBC/hpf   Bacteria, UA RARE (A) NONE SEEN   Squamous Epithelial / LPF 0-5 0 - 5   Mucus PRESENT    Ca Oxalate Crys, UA PRESENT    US RENAL  Result Date: 01/24/2022 CLINICAL DATA:  Left-sided pain, [redacted] weeks pregnant EXAM: RENAL / URINARY TRACT ULTRASOUND COMPLETE COMPARISON:  None. FINDINGS: Right Kidney: Renal measurements: 12.9 x 6.4 x 4.6 cm = volume: 99 mL. Echogenicity within normal limits. Mild right hydronephrosis and hydroureter. Left Kidney: Renal measurements: 13.1 x 5.5 x 5.4 cm = volume: 203 mL. Echogenicity within normal limits. Moderate left hydronephrosis and hydroureter. Bladder: Decompressed. Other: None. IMPRESSION: Mild right and moderate left hydronephrosis and hydroureter, possibly physiologic in the setting of advanced gestation. No calculi or other obstructing etiology  identified by ultrasound. Electronically Signed   By: Jearld LeschAlex D Bibbey M.D.   On: 01/24/2022 20:38   CT Renal Stone Study  Result Date: 01/24/2022 CLINICAL DATA:  Flank pain, kidney stone suspected. Laterality is not indicated. The patient is pregnant. EXAM: CT ABDOMEN AND PELVIS WITHOUT CONTRAST TECHNIQUE: Multidetector CT imaging of the abdomen and pelvis was performed following the standard protocol without IV contrast. RADIATION DOSE REDUCTION: This exam was performed according to the departmental dose-optimization program which includes automated exposure control, adjustment of the mA and/or kV according to patient size and/or use of iterative  reconstruction technique. COMPARISON:  Ultrasound 01/24/2022 FINDINGS: Lower chest: The lung bases are clear. Small esophageal hiatal hernia. Hepatobiliary: No focal liver abnormality is seen. No gallstones, gallbladder wall thickening, or biliary dilatation. Pancreas: Unremarkable. No pancreatic ductal dilatation or surrounding inflammatory changes. Spleen: Normal in size without focal abnormality. Adrenals/Urinary Tract: No adrenal gland nodules. Bilateral intrarenal stones, more numerous on the left. Largest stone on the left measures 4 mm diameter. Bilateral hydronephrosis and hydroureter extending to the bladder. No ureteral stones demonstrated. Dilatation is likely due to extrinsic compression from the pregnancy although the lower ureter is not well seen and occult stone cannot be entirely excluded. Bladder is decompressed but unremarkable. Stomach/Bowel: Stomach, small bowel, and colon are not abnormally distended. Stool fills the colon. No wall thickening or inflammatory changes. Appendix is normal. Vascular/Lymphatic: No significant vascular findings are present. No enlarged abdominal or pelvic lymph nodes. Reproductive: An intrauterine pregnancy is present. The fetus is in cephalic presentation with spine to the maternal left side. Placenta is posterior and  fundal. Ovaries are not specifically identified but no abnormal adnexal masses are seen. Other: No free air or free fluid in the abdomen. Abdominal wall musculature appears intact. Musculoskeletal: No acute or significant osseous findings. IMPRESSION: 1. Bilateral nonobstructing intrarenal stones. 2. Bilateral hydronephrosis and hydroureter to the level of the bladder. No obstructing ureteral stones are identified. 3. Intrauterine pregnancy. Electronically Signed   By: Burman Nieves M.D.   On: 01/24/2022 23:45    MDM PE Labs:UA EFM Ultrasound Start IV LR Pain medication Assessment and Plan  41 year old [id number]  SIUP at 34 weeks Cat I FT Left Flank Pain  -POC reviewed. -Exam performed and findings discussed. -Patient offered and declines pain medication. -Cervical exam performed and reassured minimal dilation appropriate for gestational age and parity. -UA pending but will send for renal ultrasound with suspicion for kidney stone.   Charlesetta Garibaldi Kalab Camps MSN, CNM 01/25/2022, 4:21 AM   Reassessment (4:21 AM)  -Patient calls out reports pain has increased and now requesting medication. -Provider to bedside patient reported nausea, pain, and chills. -We will start IV and give LR as well as morphine for pain. -Ultrasound ordered.  Reassessment (4:21 AM) -Report given to K. Crisoforo Oxford, CNM for assumption of care.   Marylene Land MSN, CNM Advanced Practice Provider, [facility]   2130-In to see patient and discuss [place] results, which show moderate hydroureter and moderate hydronephrosis on the left; mild on the right. She is unsure about a CT stone study and would like to know what the options would be for treatment if there is a stone.  Urology paged at 2141 and at 2220  2230: Dr. Charlotta Newton at the bedside to discuss options with patients; patient agrees to CT scan.   Reassessment (4:21 AM) Patient returned from CT; CT report shows bilateral  non-obstructing stones, normal otherwise.     1. Kidney stones   2. Left flank pain   3. [redacted] weeks gestation of pregnancy    -patient stable for discharge; reviewed importance of hydration. Patient given RX for oxy, flomax to take daily.  -long discussion about warning signs and when to come back to MAU (decreased urine output, fever, change in pain, although explained that pain may move down her urinary tract as she passes kidney stone) -patient will call [facility] tomorrow and update them on her status

## 2022-01-24 NOTE — MAU Provider Note (Incomplete Revision)
History     CSN: [id number]  Arrival date and time: 01/24/22 1635   Event Date/Time   First Provider Initiated Contact with Patient 01/24/22 1822      Chief Complaint  Patient presents with   Back Pain   Lauren Schroeder is a 41 y.o. [id number] at 145w0d who receives care at [facility].  She presents today for Back Pain. She she states the pain started at 3 PM and has been constant, but was similar to cramping.  She states now the pain is more like a "centralized jabbing" that is worsened with deep breaths or certain movements.  She rates the pain a 6/10 and reports she took Tylenol at 4 PM with no change.  She endorses fetal movement and denies perception of contractions.  She also denies vaginal concerns including bleeding, discharge, and leaking.  No pain or discomfort with urination, but she goes on to report that she feels like she has frequent urination with minimal output. Patient also reports pelvic pressure that has been "consistent for a while."    OB History     Gravida  3   Para  2   Term  1   Preterm  1   AB  0   Living  2      SAB  0   IAB  0   Ectopic  0   Multiple  0   Live Births  2           Past Medical History:  Diagnosis Date   Anxiety    mild   Depression    mild   Gestational diabetes    IBS (irritable bowel syndrome)    Medical history non-contributory     Past Surgical History:  Procedure Laterality Date   CESAREAN SECTION N/A 12/18/2016   Procedure: CESAREAN SECTION;  Surgeon: Zelphia CairoGretchen Adkins, MD;  [facility];  Service: Obstetrics;  Laterality: N/A;   DILATION AND EVACUATION N/A 11/16/2018   Procedure: DILATATION AND EVACUATION;  Surgeon: Harold Hedgeomblin, James, MD;  Location: [facility];  Service: Gynecology;  Laterality: N/A;    History reviewed. No pertinent family history.  Social History   Tobacco Use   Smoking status: Never   Smokeless tobacco: Never  Vaping Use   Vaping Use: Never used  Substance Use  Topics   Alcohol use: Not Currently   Drug use: No    Allergies:  Allergies  Allergen Reactions   Amoxicillin Hives    Has patient had a PCN reaction causing immediate rash, facial/tongue/throat swelling, SOB or lightheadedness with hypotension: unknown Has patient had a PCN reaction causing severe rash involving mucus membranes or skin necrosis: unknown Has patient had a PCN reaction that required hospitalization No Has patient had a PCN reaction occurring within the last 10 years: No If all of the above answers are "NO", then may proceed with Cephalosporin use.    Lactose Intolerance (Gi) Other (See Comments)    GI Upset    Medications Prior to Admission  Medication Sig Dispense Refill Last Dose   aspirin 81 MG chewable tablet Chew 81 mg by mouth daily.   01/23/2022 at 2200   cetirizine (ZYRTEC) 10 MG tablet Take 10 mg by mouth at bedtime.    Past Week   citalopram (CELEXA) 40 MG tablet Take 40 mg by mouth at bedtime.   01/23/2022 at 2200   Prenatal Vit-Fe Fumarate-FA (PRENATAL MULTIVITAMIN) TABS tablet Take 1 tablet by mouth at bedtime.  01/23/2022 at 2200   acetaminophen (TYLENOL 8 HOUR) 650 MG CR tablet Take 1 tablet (650 mg total) by mouth every 8 (eight) hours as needed for pain. 30 tablet 0 More than a month   ibuprofen (ADVIL,MOTRIN) 600 MG tablet Take 1 tablet (600 mg total) by mouth every 6 (six) hours as needed. 30 tablet 0 More than a month    Review of Systems  Constitutional:  Negative for chills and fever.  Respiratory:  Negative for cough and shortness of breath.   Gastrointestinal:  Negative for constipation, diarrhea, nausea and vomiting.  Genitourinary:  Positive for frequency (Every 20 minutes with minimal output) and pelvic pain (Pressure). Negative for difficulty urinating, dysuria, vaginal bleeding and vaginal discharge.  Musculoskeletal:  Positive for back pain.  Neurological:  Negative for dizziness, light-headedness and headaches.  Physical Exam   Blood  pressure (!) 105/58, pulse 83, temperature 98.3 F (36.8 C), temperature source Oral, resp. rate 16, height 5\' 1"  (1.549 m), weight 70.9 kg, SpO2 100 %, unknown if currently breastfeeding.  Physical Exam Vitals reviewed. Exam conducted with a chaperone present.  Constitutional:      Appearance: Normal appearance.  HENT:     Head: Normocephalic and atraumatic.  Eyes:     Conjunctiva/sclera: Conjunctivae normal.  Cardiovascular:     Rate and Rhythm: Normal rate and regular rhythm.  Abdominal:     Palpations: Abdomen is soft.     Tenderness: There is no abdominal tenderness.  Genitourinary:    General: Normal vulva.     Comments: -1cm/50/Ballotable Vertex Musculoskeletal:        General: Normal range of motion.     Cervical back: Normal range of motion.  Skin:    General: Skin is warm and dry.  Neurological:     Mental Status: She is alert and oriented to person, place, and time.  Psychiatric:        Mood and Affect: Mood normal.        Behavior: Behavior normal.        Thought Content: Thought content normal.    Fetal Assessment 145 bpm, Mod Var, -Decels, +Accels Toco: Ctx Q 5-6 min  MAU Course   Results for orders placed or performed during the hospital encounter of 01/24/22 (from the past 24 hour(s))  Urinalysis, Routine w reflex microscopic Urine, Clean Catch     Status: Abnormal   Collection Time: 01/24/22  5:28 PM  Result Value Ref Range   Color, Urine YELLOW YELLOW   APPearance CLEAR CLEAR   Specific Gravity, Urine 1.009 1.005 - 1.030   pH 7.0 5.0 - 8.0   Glucose, UA NEGATIVE NEGATIVE mg/dL   Hgb urine dipstick LARGE (A) NEGATIVE   Bilirubin Urine NEGATIVE NEGATIVE   Ketones, ur NEGATIVE NEGATIVE mg/dL   Protein, ur NEGATIVE NEGATIVE mg/dL   Nitrite NEGATIVE NEGATIVE   Leukocytes,Ua NEGATIVE NEGATIVE   RBC / HPF >50 (H) 0 - 5 RBC/hpf   WBC, UA 0-5 0 - 5 WBC/hpf   Bacteria, UA RARE (A) NONE SEEN   Squamous Epithelial / LPF 0-5 0 - 5   Mucus PRESENT    Ca  Oxalate Crys, UA PRESENT    No results found.  MDM PE Labs:UA EFM Ultrasound Start IV LR Pain medication Assessment and Plan  41 year old G3P1102  SIUP at 34 weeks Cat I FT Left Flank Pain  -POC reviewed. -Exam performed and findings discussed. -Patient offered and declines pain medication. -Cervical exam performed and reassured  minimal dilation appropriate for gestational age and parity. -UA pending but will send for renal ultrasound with suspicion for kidney stone.   Cherre Robins MSN, CNM 01/24/2022, 6:23 PM   Reassessment (7:15 PM)  -Patient calls out reports pain has increased and now requesting medication. -Provider to bedside patient reported nausea, pain, and chills. -We will start IV and give LR as well as morphine for pain. -Ultrasound ordered.  Reassessment (8:43 PM) -Report given to K. Crisoforo Oxford, CNM for assumption of care.   Cherre Robins MSN, CNM Advanced Practice Provider, [facility]   2130-In to see patient and discuss [place] results, which show moderate hydroureter and moderate hydronephrosis on the left; mild on the right. She is unsure about a CT stone study and would like to know what the options would be for treatment if there is a stone.  Urology paged at 2141 at 2220  2230: Dr. Charlotta Newton at the bedside to discuss options with patients

## 2022-01-25 DIAGNOSIS — Z3A34 34 weeks gestation of pregnancy: Secondary | ICD-10-CM

## 2022-01-25 DIAGNOSIS — N200 Calculus of kidney: Secondary | ICD-10-CM

## 2022-01-25 MED ORDER — ONDANSETRON 8 MG PO TBDP
8.0000 mg | ORAL_TABLET | Freq: Three times a day (TID) | ORAL | 0 refills | Status: DC | PRN
Start: 2022-01-25 — End: 2022-03-07

## 2022-01-25 MED ORDER — OXYCODONE HCL 5 MG PO TABS: 5.0000 mg | ORAL_TABLET | ORAL | 0 refills | Status: DC | PRN

## 2022-01-25 MED ORDER — TAMSULOSIN HCL 0.4 MG PO CAPS: 0.4000 mg | ORAL_CAPSULE | Freq: Every day | ORAL | 0 refills | Status: DC

## 2022-01-25 NOTE — Progress Notes (Signed)
Patient signed physical copy AVS due to signature pad malfunctioning on 01/25/22 0134. ? ?Harl Bowie RN  ?

## 2022-01-27 DIAGNOSIS — Z23 Encounter for immunization: Secondary | ICD-10-CM | POA: Diagnosis not present

## 2022-02-05 DIAGNOSIS — N202 Calculus of kidney with calculus of ureter: Secondary | ICD-10-CM | POA: Diagnosis not present

## 2022-02-12 DIAGNOSIS — Z3685 Encounter for antenatal screening for Streptococcus B: Secondary | ICD-10-CM | POA: Diagnosis not present

## 2022-02-12 DIAGNOSIS — O26843 Uterine size-date discrepancy, third trimester: Secondary | ICD-10-CM | POA: Diagnosis not present

## 2022-02-12 DIAGNOSIS — Z3A36 36 weeks gestation of pregnancy: Secondary | ICD-10-CM | POA: Diagnosis not present

## 2022-02-23 ENCOUNTER — Telehealth (HOSPITAL_COMMUNITY): Payer: Self-pay | Admitting: *Deleted

## 2022-02-23 NOTE — Telephone Encounter (Signed)
Preadmission screen  

## 2022-02-24 ENCOUNTER — Encounter (HOSPITAL_COMMUNITY): Payer: Self-pay | Admitting: *Deleted

## 2022-03-05 ENCOUNTER — Inpatient Hospital Stay (HOSPITAL_COMMUNITY): Payer: BC Managed Care – PPO

## 2022-03-05 ENCOUNTER — Encounter (HOSPITAL_COMMUNITY): Payer: Self-pay | Admitting: Obstetrics & Gynecology

## 2022-03-05 ENCOUNTER — Inpatient Hospital Stay (HOSPITAL_COMMUNITY)
Admission: AD | Admit: 2022-03-05 | Payer: BC Managed Care – PPO | Source: Home / Self Care | Admitting: Obstetrics & Gynecology

## 2022-03-05 ENCOUNTER — Inpatient Hospital Stay (HOSPITAL_COMMUNITY)
Admission: AD | Admit: 2022-03-05 | Discharge: 2022-03-07 | DRG: 807 | Disposition: A | Discharge status: Home / Self Care | Payer: BC Managed Care – PPO | Attending: Obstetrics and Gynecology | Admitting: Obstetrics and Gynecology

## 2022-03-05 DIAGNOSIS — Z3A39 39 weeks gestation of pregnancy: Secondary | ICD-10-CM

## 2022-03-05 DIAGNOSIS — O34219 Maternal care for unspecified type scar from previous cesarean delivery: Secondary | ICD-10-CM | POA: Diagnosis not present

## 2022-03-05 DIAGNOSIS — Z98891 History of uterine scar from previous surgery: Principal | ICD-10-CM

## 2022-03-05 DIAGNOSIS — F419 Anxiety disorder, unspecified: Secondary | ICD-10-CM | POA: Diagnosis present

## 2022-03-05 DIAGNOSIS — F32A Depression, unspecified: Secondary | ICD-10-CM | POA: Diagnosis not present

## 2022-03-05 DIAGNOSIS — O26893 Other specified pregnancy related conditions, third trimester: Secondary | ICD-10-CM | POA: Diagnosis not present

## 2022-03-05 DIAGNOSIS — O09513 Supervision of elderly primigravida, third trimester: Secondary | ICD-10-CM | POA: Diagnosis not present

## 2022-03-05 DIAGNOSIS — O99344 Other mental disorders complicating childbirth: Secondary | ICD-10-CM | POA: Diagnosis not present

## 2022-03-05 DIAGNOSIS — Z3A00 Weeks of gestation of pregnancy not specified: Secondary | ICD-10-CM | POA: Diagnosis not present

## 2022-03-05 DIAGNOSIS — Z23 Encounter for immunization: Secondary | ICD-10-CM | POA: Diagnosis not present

## 2022-03-05 LAB — CBC
HCT: 37.4 % (ref 36.0–46.0)
Hemoglobin: 13 g/dL (ref 12.0–15.0)
MCH: 31.2 pg (ref 26.0–34.0)
MCHC: 34.8 g/dL (ref 30.0–36.0)
MCV: 89.7 fL (ref 80.0–100.0)
Platelets: 331 10*3/uL (ref 150–400)
RBC: 4.17 MIL/uL (ref 3.87–5.11)
RDW: 12.9 % (ref 11.5–15.5)
WBC: 12.7 10*3/uL — ABNORMAL HIGH (ref 4.0–10.5)
nRBC: 0 % (ref 0.0–0.2)

## 2022-03-05 LAB — TYPE AND SCREEN
ABO/RH(D): A POS
Antibody Screen: NEGATIVE

## 2022-03-05 LAB — RPR: RPR Ser Ql: NONREACTIVE

## 2022-03-05 MED ORDER — SENNOSIDES-DOCUSATE SODIUM 8.6-50 MG PO TABS
2.0000 | ORAL_TABLET | ORAL | Status: DC
  Filled 2022-03-05 (×3): qty 2

## 2022-03-05 MED ORDER — LIDOCAINE HCL (PF) 1 % IJ SOLN
30.0000 mL | INTRAMUSCULAR | Status: AC | PRN
  Administered 2022-03-05: 30 mL via SUBCUTANEOUS
  Filled 2022-03-05: qty 30

## 2022-03-05 MED ORDER — ONDANSETRON HCL 4 MG PO TABS: 4.0000 mg | ORAL_TABLET | ORAL | Status: DC | PRN

## 2022-03-05 MED ORDER — OXYCODONE HCL 5 MG PO TABS: 10.0000 mg | ORAL_TABLET | ORAL | Status: DC | PRN

## 2022-03-05 MED ORDER — DIPHENHYDRAMINE HCL 25 MG PO CAPS: 25.0000 mg | ORAL_CAPSULE | Freq: Four times a day (QID) | ORAL | Status: DC | PRN

## 2022-03-05 MED ORDER — PRENATAL MULTIVITAMIN CH
1.0000 | ORAL_TABLET | Freq: Every day | ORAL | Status: DC
  Administered 2022-03-05 – 2022-03-07 (×3): 1 via ORAL
  Filled 2022-03-05 (×3): qty 1

## 2022-03-05 MED ORDER — FENTANYL CITRATE (PF) 100 MCG/2ML IJ SOLN: 50.0000 ug | INTRAMUSCULAR | Status: DC | PRN

## 2022-03-05 MED ORDER — ACETAMINOPHEN 325 MG PO TABS
650.0000 mg | ORAL_TABLET | ORAL | Status: DC | PRN
  Administered 2022-03-05 – 2022-03-07 (×4): 650 mg via ORAL
  Filled 2022-03-05 (×4): qty 2

## 2022-03-05 MED ORDER — DIPHENHYDRAMINE HCL 50 MG/ML IJ SOLN: 12.5000 mg | INTRAMUSCULAR | Status: DC | PRN

## 2022-03-05 MED ORDER — SIMETHICONE 80 MG PO CHEW: 80.0000 mg | CHEWABLE_TABLET | ORAL | Status: DC | PRN

## 2022-03-05 MED ORDER — OXYCODONE-ACETAMINOPHEN 5-325 MG PO TABS: 1.0000 | ORAL_TABLET | ORAL | Status: DC | PRN

## 2022-03-05 MED ORDER — ONDANSETRON HCL 4 MG/2ML IJ SOLN: 4.0000 mg | INTRAMUSCULAR | Status: DC | PRN

## 2022-03-05 MED ORDER — COCONUT OIL OIL
1.0000 | TOPICAL_OIL | Status: DC | PRN
Start: 2022-03-05 — End: 2022-03-07
  Administered 2022-03-06: 1 via TOPICAL

## 2022-03-05 MED ORDER — ACETAMINOPHEN 325 MG PO TABS
650.0000 mg | ORAL_TABLET | ORAL | Status: DC | PRN
  Administered 2022-03-05: 650 mg via ORAL
  Filled 2022-03-05: qty 2

## 2022-03-05 MED ORDER — LACTATED RINGERS IV SOLN: 500.0000 mL | INTRAVENOUS | Status: DC | PRN

## 2022-03-05 MED ORDER — OXYCODONE-ACETAMINOPHEN 5-325 MG PO TABS: 2.0000 | ORAL_TABLET | ORAL | Status: DC | PRN

## 2022-03-05 MED ORDER — FENTANYL-BUPIVACAINE-NACL 0.5-0.125-0.9 MG/250ML-% EP SOLN
12.0000 mL/h | EPIDURAL | Status: DC | PRN
  Filled 2022-03-05: qty 250

## 2022-03-05 MED ORDER — OXYTOCIN-SODIUM CHLORIDE 30-0.9 UT/500ML-% IV SOLN: 2.5000 [IU]/h | INTRAVENOUS | Status: DC

## 2022-03-05 MED ORDER — IBUPROFEN 600 MG PO TABS
600.0000 mg | ORAL_TABLET | Freq: Four times a day (QID) | ORAL | Status: DC
  Administered 2022-03-05 – 2022-03-07 (×9): 600 mg via ORAL
  Filled 2022-03-05 (×9): qty 1

## 2022-03-05 MED ORDER — ONDANSETRON HCL 4 MG/2ML IJ SOLN: 4.0000 mg | Freq: Four times a day (QID) | INTRAMUSCULAR | Status: DC | PRN

## 2022-03-05 MED ORDER — TETANUS-DIPHTH-ACELL PERTUSSIS 5-2.5-18.5 LF-MCG/0.5 IM SUSY: 0.5000 mL | PREFILLED_SYRINGE | Freq: Once | INTRAMUSCULAR | Status: DC

## 2022-03-05 MED ORDER — SOD CITRATE-CITRIC ACID 500-334 MG/5ML PO SOLN: 30.0000 mL | ORAL | Status: DC | PRN

## 2022-03-05 MED ORDER — EPHEDRINE 5 MG/ML INJ: 10.0000 mg | INTRAVENOUS | Status: DC | PRN

## 2022-03-05 MED ORDER — LACTATED RINGERS IV SOLN: INTRAVENOUS | Status: DC

## 2022-03-05 MED ORDER — CITALOPRAM HYDROBROMIDE 20 MG PO TABS
40.0000 mg | ORAL_TABLET | Freq: Every day | ORAL | Status: DC
Start: 2022-03-05 — End: 2022-03-07
  Administered 2022-03-06: 40 mg via ORAL
  Filled 2022-03-05 (×2): qty 2

## 2022-03-05 MED ORDER — ZOLPIDEM TARTRATE 5 MG PO TABS: 5.0000 mg | ORAL_TABLET | Freq: Every evening | ORAL | Status: DC | PRN

## 2022-03-05 MED ORDER — TERBUTALINE SULFATE 1 MG/ML IJ SOLN: 0.2500 mg | Freq: Once | INTRAMUSCULAR | Status: DC | PRN

## 2022-03-05 MED ORDER — WITCH HAZEL-GLYCERIN EX PADS: 1.0000 "application " | MEDICATED_PAD | CUTANEOUS | Status: DC | PRN

## 2022-03-05 MED ORDER — OXYTOCIN BOLUS FROM INFUSION
333.0000 mL | Freq: Once | INTRAVENOUS | Status: AC
  Administered 2022-03-05: 333 mL via INTRAVENOUS

## 2022-03-05 MED ORDER — BENZOCAINE-MENTHOL 20-0.5 % EX AERO
1.0000 "application " | INHALATION_SPRAY | CUTANEOUS | Status: DC | PRN
  Filled 2022-03-05: qty 56

## 2022-03-05 MED ORDER — OXYTOCIN-SODIUM CHLORIDE 30-0.9 UT/500ML-% IV SOLN
1.0000 m[IU]/min | INTRAVENOUS | Status: DC
  Filled 2022-03-05: qty 500

## 2022-03-05 MED ORDER — PHENYLEPHRINE 80 MCG/ML (10ML) SYRINGE FOR IV PUSH (FOR BLOOD PRESSURE SUPPORT): 80.0000 ug | PREFILLED_SYRINGE | INTRAVENOUS | Status: DC | PRN

## 2022-03-05 MED ORDER — OXYCODONE HCL 5 MG PO TABS: 5.0000 mg | ORAL_TABLET | ORAL | Status: DC | PRN

## 2022-03-05 MED ORDER — LACTATED RINGERS IV SOLN: 500.0000 mL | Freq: Once | INTRAVENOUS | Status: DC

## 2022-03-05 MED ORDER — DIBUCAINE (PERIANAL) 1 % EX OINT: 1.0000 "application " | TOPICAL_OINTMENT | CUTANEOUS | Status: DC | PRN

## 2022-03-05 NOTE — Lactation Note (Signed)
This note was copied from a baby's chart. ?Lactation Consultation Note ? ?Patient Name: Lauren Schroeder ?Today's Date: 03/05/2022 ?Reason for consult: Initial assessment ?Age:39 hours ? ?P3, Mother states baby has latched briefly x 2 since birth. ?Mother denies questions or concerns and is happy this baby is latching since she had difficulty latching first child. ?Suggest she call for assistance as needed. ?Feed on demand with cues.  Goal 8-12+ times per day after first 24 hrs.  Place baby STS if not cueing.  ?Mom made aware of O/P services, breastfeeding support groups,  and our phone # for post-discharge questions.  ? ? ?Maternal Data ?Has patient been taught Hand Expression?: Yes ?Does the patient have breastfeeding experience prior to this delivery?: Yes ?How long did the patient breastfeed?: 6 mos and 1 year ? ?Feeding ?Mother's Current Feeding Choice: Breast Milk ? ?LATCH Score ?Latch: Grasps breast easily, tongue down, lips flanged, rhythmical sucking. ? ?Audible Swallowing: A few with stimulation ? ?Type of Nipple: Everted at rest and after stimulation ? ?Comfort (Breast/Nipple): Soft / non-tender ? ?Hold (Positioning): No assistance needed to correctly position infant at breast. ? ?LATCH Score: 9 ? ? ?Interventions ?Interventions: Education;LC Services brochure ? ? ?Consult Status ?Consult Status: Follow-up ?Date: 03/06/22 ?Follow-up type: In-patient ? ? ? ?Dahlia Byes Boschen ?03/05/2022, 8:02 AM ? ? ? ?

## 2022-03-05 NOTE — Progress Notes (Signed)
Delivery Note ?At 4:59 AM a viable female was delivered via Vaginal, Spontaneous (Presentation:   Occiput Anterior).  APGAR: , ; weight  .   ?Placenta status: Spontaneous, Intact to path.  Cord: 3 vessels with the following complications: None.  Cord pH: art pH pending ?Delivered on right side per patient preference ?Rapid second stage over about 4 UCs ?Thin mec noted at delivery ?Anesthesia: None ?Episiotomy: None ?Lacerations:  second degree transection of upper left labia minora repaiired ?First deree laceration @ 9:00 introitus not bleeding, not repaired ?Suture Repair: 2.0 vicryl rapide ?Est. Blood Loss (mL):  150 ? ?Mom to postpartum.  Baby to Couplet care / Skin to Skin. ? ?Shon Millet II ?03/05/2022, 5:28 AM ? ?

## 2022-03-05 NOTE — Lactation Note (Addendum)
This note was copied from a baby's chart. ?Lactation Consultation Note ? ?Patient Name: Lauren Schroeder ?Today's Date: 03/05/2022 ?Reason for consult: Mother's request;Follow-up assessment;Term ?Age:39 hours ? ?Ms. Ellefson paged lactation for reassurance. Baby [name] has been latching well, per mom. I observed her latch him to the right breast in cross cradle position. I offered some positioning guidance, primarily to switch her hand hold from a C hold to a U hold.  ? ?I reviewed day 1-2 infant feeding patterns, and signs that baby is getting enough to eat. ? ?Maternal Data ?Has patient been taught Hand Expression?: Yes ?Does the patient have breastfeeding experience prior to this delivery?: Yes ?How long did the patient breastfeed?: 6 mos and 1 year ? ?Feeding ?Mother's Current Feeding Choice: Breast Milk ? ?LATCH Score ?Latch: Grasps breast easily, tongue down, lips flanged, rhythmical sucking. ? ?Audible Swallowing: A few with stimulation ? ?Type of Nipple: Everted at rest and after stimulation ? ?Comfort (Breast/Nipple): Soft / non-tender ? ?Hold (Positioning): Assistance needed to correctly position infant at breast and maintain latch. ? ?LATCH Score: 8 ? ?Interventions ?Interventions: Breast feeding basics reviewed;Assisted with latch;Skin to skin;Hand express;Education ? ?Consult Status ?Consult Status: Follow-up ?Date: 03/06/22 ?Follow-up type: In-patient ? ? ? ?Walker Shadow ?03/05/2022, 4:37 PM ? ? ? ?

## 2022-03-05 NOTE — Progress Notes (Signed)
Patient did not have an epidural. Avatar showed epidural was "still in" from her visit in 2018.  ? ?

## 2022-03-05 NOTE — H&P (Signed)
Lauren Schroeder is a 41 y.o. female presenting for labor. Scheduled for IOL today. Presents to MAU with SROM followed by strong UCs. Pregnancy complicated by Hx of C/S followed by successful VBAC with plans for TOLAC this pregnanacy. Anxiety/depression treated with citalopram 40mg  qd, AMA with normal Panorama, and kidney stone this pregnancy treated with Flomax and fluids. ?OB History   ? ? Gravida  ?3  ? Para  ?2  ? Term  ?1  ? Preterm  ?1  ? AB  ?0  ? Living  ?2  ?  ? ? SAB  ?0  ? IAB  ?0  ? Ectopic  ?0  ? Multiple  ?0  ? Live Births  ?2  ?   ?  ?  ? ?Past Medical History:  ?Diagnosis Date  ? Anxiety   ? mild  ? Depression   ? mild  ? Gestational diabetes   ? IBS (irritable bowel syndrome)   ? IBS (irritable bowel syndrome)   ? Medical history non-contributory   ? ?Past Surgical History:  ?Procedure Laterality Date  ? CESAREAN SECTION N/A 12/18/2016  ? Procedure: CESAREAN SECTION;  Surgeon: 02/15/2017, MD;  Location: [facility];  Service: Obstetrics;  Laterality: N/A;  ? DILATION AND EVACUATION N/A 11/16/2018  ? Procedure: DILATATION AND EVACUATION;  Surgeon: 01/15/2019, MD;  Location: [facility];  Service: Gynecology;  Laterality: N/A;  ? ?Family History: family history is not on file. ?Social History:  reports that she has never smoked. She has never used smokeless tobacco. She reports that she does not currently use alcohol. She reports that she does not use drugs. ? ? ?  ?Maternal Diabetes: No ?Genetic Screening: Normal ?Maternal Ultrasounds/Referrals: Normal ?Fetal Ultrasounds or other Referrals:  None ?Maternal Substance Abuse:  No ?Significant Maternal Medications:  Meds include: Other: citalopram ?Significant Maternal Lab Results:  Group B Strep negative ?Other Comments:  None ? ?Review of Systems  ?Constitutional:  Negative for fever.  ?Eyes:  Negative for visual disturbance.  ?Gastrointestinal:  Negative for abdominal pain.  ?Neurological:  Negative for headaches.  ?Maternal Medical History:   ?Reason for admission: Rupture of membranes and contractions.  ? ?Fetal activity: Perceived fetal activity is normal.   ? ?Dilation: 10 ?Effacement (%): 100 ?Station: Plus 2 ?Exam by:: Dr. [email or web address] ?unknown if currently breastfeeding. ?Maternal Exam:  ?Abdomen: Patient reports no abdominal tenderness. Introitus: Normal vulva.  ?Physical Exam ?Cardiovascular:  ?   Rate and Rhythm: Normal rate.  ?Pulmonary:  ?   Effort: Pulmonary effort is normal.  ?Genitourinary: ?   General: Normal vulva.  ?  ?Prenatal labs: ?ABO, Rh: --/--/A POS (04/21 0445) ?Antibody: PENDING (04/21 0445) ?Rubella: Immune (09/14 0000) ?RPR: Nonreactive (09/14 0000)  ?HBsAg: Negative (09/14 0000)  ?HIV: Non-reactive (09/14 0000)  ?GBS:   negative 02/12/22 ? ?Assessment/Plan: ?41 yo G4P2 ?Active labor ?Hx of C/S for TOLAC ?Strong urge to push-anticipate vaginal delivery  ? ?24 II ?03/05/2022, 5:21 AM ? ? ? ? ?

## 2022-03-05 NOTE — MAU Note (Signed)
Pt presented to MAU by w/c at 0423 stating her water broke at 0330 and she is uncomfortable with ctxs. Reports history of rapid labor and wants an epidural which she did not have time for with last delivery. Helped to undress and into bed. SVE 8/100/+1. States fld was yellow tinged but no fld noted and perineum dry. Denies any pregnancy concerns and GBS neg. Dr Henderson Cloud notified by Estanislado Spire NP and Arlyn Dunning RN CN on BS called. Pt to 116 via stretcher with Estanislado Spire NP and Quintella Baton RN with pt. Dopplered FHTs 140s in MAU before transfer to BS ?

## 2022-03-06 LAB — CBC
HCT: 32.3 % — ABNORMAL LOW (ref 36.0–46.0)
Hemoglobin: 11 g/dL — ABNORMAL LOW (ref 12.0–15.0)
MCH: 31.8 pg (ref 26.0–34.0)
MCHC: 34.1 g/dL (ref 30.0–36.0)
MCV: 93.4 fL (ref 80.0–100.0)
Platelets: 265 10*3/uL (ref 150–400)
RBC: 3.46 MIL/uL — ABNORMAL LOW (ref 3.87–5.11)
RDW: 13.3 % (ref 11.5–15.5)
WBC: 10.8 10*3/uL — ABNORMAL HIGH (ref 4.0–10.5)
nRBC: 0 % (ref 0.0–0.2)

## 2022-03-06 NOTE — Progress Notes (Signed)
Post Partum Day 1 ?Subjective: ?no complaints, up ad lib, voiding, and tolerating PO.  Per peds note, epispadias in newborn. ? ?Objective: ?Blood pressure 101/68, pulse 74, temperature 98.1 ?F (36.7 ?C), temperature source Oral, resp. rate 16, SpO2 99 %, unknown if currently breastfeeding. ? ?Physical Exam:  ?General: alert, cooperative, and appears stated age ?Lochia: appropriate ?Uterine Fundus: firm ?Incision: healing well, no significant drainage ?DVT Evaluation: No evidence of DVT seen on physical exam. ?Negative Homan's sign. ?No cords or calf tenderness. ? ?Recent Labs  ?  03/05/22 ?[id number] 03/06/22 ?0501  ?HGB 13.0 11.0*  ?HCT 37.4 32.3*  ? ? ?Assessment/Plan: ?Plan for discharge tomorrow and Breastfeeding ?Plan outpt circ with urology ? ? LOS: 1 day  ? ?Jinny Blossom Tycen Dockter ?03/06/2022, 6:22 AM  ? ? ?

## 2022-03-06 NOTE — Lactation Note (Signed)
This note was copied from a baby's chart. ?Lactation Consultation Note ? ?Patient Name: Boy Kelicia Eble ?Today's Date: 03/06/2022 ?Reason for consult: Follow-up assessment;Term ?Age:39 hours ? ? ?P3 mother whose infant is now 50 hours old.  This is a term baby at 39+5 weeks.  Mother breast fed her first two children.  Her current feeding preference is breast. ? ?RN in room assisting with latch; baby in cross cradle hold.  Mother has reported some long feeds lasting up to two hours in length.  Reviewed breast feeding basics and asked mother to remove blanket from baby; suggested she release '[name]" from the breast so I may observe her latching.  When mother released "[name]" she had a white positional stripe across her nipple.  Miles fell asleep immediately and stayed calm.  Discussed ways to obtain a deep effective latch.  Mother verbalized understanding.  "[name]" had also breast fed for 15 minutes approximately 1-1/2 hours ago.  Suggested she wait until he is showing cues and call for assistance if needed.  Mother agreeable. ? ?Visitors present and assisting with care.  RN updated. ? ? ?Maternal Data ?  ? ?Feeding ?Mother's Current Feeding Choice: Breast Milk ? ?LATCH Score ?Latch: Grasps breast easily, tongue down, lips flanged, rhythmical sucking. ? ?Audible Swallowing: A few with stimulation ? ?Type of Nipple: Everted at rest and after stimulation ? ?Comfort (Breast/Nipple): Soft / non-tender ? ?Hold (Positioning): Assistance needed to correctly position infant at breast and maintain latch. ? ?LATCH Score: 8 ? ? ?Lactation Tools Discussed/Used ?  ? ?Interventions ?Interventions: Breast feeding basics reviewed;Education ? ?Discharge ?Pump: Personal ? ?Consult Status ?Consult Status: Follow-up ?Date: 03/07/22 ?Follow-up type: In-patient ? ? ? ?Irene Pap Talayeh Bruinsma ?03/06/2022, 3:39 PM ? ? ? ?

## 2022-03-06 NOTE — Social Work (Signed)
MOB was referred for history of depression/anxiety. ? ? MOB's symptoms currently being treated with medication and/or therapy. ? ?Please contact the Clinical Social Worker if needs arise, by MOB request, or if MOB scores greater than 9/yes to question 10 on Edinburgh Postpartum Depression Screen. ? ?Joss Mcdill, LCSW ?Clinical Social Worker ?

## 2022-03-07 MED ORDER — IBUPROFEN 600 MG PO TABS: 600.0000 mg | ORAL_TABLET | Freq: Four times a day (QID) | ORAL | 0 refills | Status: DC

## 2022-03-07 NOTE — Discharge Instructions (Signed)
Call MD for T>100.4, heavy vaginal bleeding, severe abdominal pain or respiratory distress.  Call office to schedule postpartum visit in 6 weeks.  Pelvic rest x 6 weeks.   

## 2022-03-07 NOTE — Progress Notes (Signed)
Post Partum Day 2 ?Subjective: ?no complaints, up ad lib, voiding, and tolerating PO ? ?Objective: ?Blood pressure (!) 110/53, pulse 82, temperature 98.4 ?F (36.9 ?C), temperature source Oral, resp. rate 16, SpO2 99 %, unknown if currently breastfeeding. ? ?Physical Exam:  ?General: alert, cooperative, and appears stated age ?Lochia: appropriate ?Uterine Fundus: firm ?Incision: healing well, no significant drainage ?DVT Evaluation: No evidence of DVT seen on physical exam. ?Negative Homan's sign. ?No cords or calf tenderness. ? ?Recent Labs  ?  03/05/22 ?1610 03/06/22 ?0501  ?HGB 13.0 11.0*  ?HCT 37.4 32.3*  ? ? ?Assessment/Plan: ?Discharge home and Breastfeeding ? ? LOS: 2 days  ? ?Lauren Schroeder ?03/07/2022, 8:03 AM  ? ? ?

## 2022-03-07 NOTE — Discharge Summary (Signed)
? ?  Postpartum Discharge Summary ? ?Patient Name: Lauren Schroeder ?DOB: [birth year] ?MRN: [id number] ? ?Date of admission: 03/05/2022 ?Delivery date:03/05/2022  ?Delivering provider: Everlene Farrier  ?Date of discharge: 03/07/2022 ? ?Admitting diagnosis: Previous cesarean section [Z98.891] ?Intrauterine pregnancy: [redacted]w[redacted]d    ?Secondary diagnosis:  Principal Problem: ?  Previous cesarean section ? ?Additional problems: none    ?Discharge diagnosis: Term Pregnancy Delivered and VBAC                                              ?Post partum procedures: none ?Augmentation: N/A ?Complications: None ? ?Hospital course: Onset of Labor With Vaginal Delivery      ?41y.o. yo [id number] 381w5das admitted in Active Labor on 03/05/2022. Patient had an uncomplicated labor course as follows:  ?Membrane Rupture Time/Date: 3:03 AM ,03/05/2022   ?Delivery Method:Vaginal, Spontaneous  ?Episiotomy: None  ?Lacerations:  2nd degree  ?Patient had an uncomplicated postpartum course.  She is ambulating, tolerating a regular diet, passing flatus, and urinating well. Patient is discharged home in stable condition on 03/07/22. ? ?Newborn Data: ?Birth date:03/05/2022  ?Birth time:4:59 AM  ?Gender:Female  ?Living status:Living  ?Apgars:8 ,8  ?WeTIRWER:1540  ? ?Magnesium Sulfate received: No ?BMZ received: No ?Rhophylac:No ?MMR:No ?T-DaP:Given prenatally ?Flu: No ?Transfusion:No ? ?Physical exam  ?Vitals:  ? 03/06/22 0539 03/06/22 1400 03/06/22 1925 03/07/22 0504  ?BP: 101/68 (!) 109/57 101/63 (!) 110/53  ?Pulse: 74 75 83 82  ?Resp: '16 16 18 16  ' ?Temp: 98.1 ?F (36.7 ?C) 97.9 ?F (36.6 ?C) 98 ?F (36.7 ?C) 98.4 ?F (36.9 ?C)  ?TempSrc: Oral Oral Oral Oral  ?SpO2: 99%     ? ?General: alert, cooperative, and no distress ?Lochia: appropriate ?Uterine Fundus: firm ?Incision: Healing well with no significant drainage, No significant erythema ?DVT Evaluation: No evidence of DVT seen on physical exam. ?Negative Homan's sign. ?Labs: ?Lab Results  ?Component Value Date   ? WBC 10.8 (H) 03/06/2022  ? HGB 11.0 (L) 03/06/2022  ? HCT 32.3 (L) 03/06/2022  ? MCV 93.4 03/06/2022  ? PLT 265 03/06/2022  ? ?   ? View : No data to display.  ?  ?  ?  ? ?Edinburgh Score: ? ?  03/05/2022  ?  8:18 PM  ?EdFlavia Shipperostnatal Depression Scale Screening Tool  ?I have been able to laugh and see the funny side of things. 0  ?I have looked forward with enjoyment to things. 0  ?I have blamed myself unnecessarily when things went wrong. 0  ?I have been anxious or worried for no good reason. 1  ?I have felt scared or panicky for no good reason. 0  ?Things have been getting on top of me. 0  ?I have been so unhappy that I have had difficulty sleeping. 0  ?I have felt sad or miserable. 0  ?I have been so unhappy that I have been crying. 0  ?The thought of harming myself has occurred to me. 0  ?Edinburgh Postnatal Depression Scale Total 1  ? ? ? ?After visit meds:  ?Allergies as of 03/07/2022   ? ?   Reactions  ? Amoxicillin Hives  ? Has patient had a PCN reaction causing immediate rash, facial/tongue/throat swelling, SOB or lightheadedness with hypotension: unknown ?Has patient had a PCN reaction causing severe rash involving mucus membranes or skin necrosis: unknown ?Has patient had  a PCN reaction that required hospitalization No ?Has patient had a PCN reaction occurring within the last 10 years: No ?If all of the above answers are "NO", then may proceed with Cephalosporin use.  ? Lactose Intolerance (gi) Other (See Comments)  ? GI Upset  ? ?  ? ?  ?Medication List  ?  ? ?STOP taking these medications   ? ?aspirin 81 MG chewable tablet ?  ?ondansetron 8 MG disintegrating tablet ?Commonly known as: ZOFRAN-ODT ?  ?oxyCODONE 5 MG immediate release tablet ?Commonly known as: Oxy IR/ROXICODONE ?  ?tamsulosin 0.4 MG Caps capsule ?Commonly known as: FLOMAX ?  ? ?  ? ?TAKE these medications   ? ?acetaminophen 650 MG CR tablet ?Commonly known as: Tylenol 8 Hour ?Take 1 tablet (650 mg total) by mouth every 8 (eight)  hours as needed for pain. ?  ?cetirizine 10 MG tablet ?Commonly known as: ZYRTEC ?Take 10 mg by mouth at bedtime. ?  ?citalopram 40 MG tablet ?Commonly known as: CELEXA ?Take 40 mg by mouth at bedtime. ?  ?ibuprofen 600 MG tablet ?Commonly known as: ADVIL ?Take 1 tablet (600 mg total) by mouth every 6 (six) hours. ?  ?prenatal multivitamin Tabs tablet ?Take 1 tablet by mouth at bedtime. ?  ? ?  ? ? ? ?Discharge home in stable condition ?Infant Feeding: Breast ?Infant Disposition:home with mother ?Discharge instruction: per After Visit Summary and Postpartum booklet. ?Activity: Advance as tolerated. Pelvic rest for 6 weeks.  ?Diet: routine diet ?Future Appointments:No future appointments. ?Follow up Visit: 6 weeks PPV ? ? ?03/07/2022 ?Linda Hedges, DO ? ? ? ?

## 2022-03-07 NOTE — Lactation Note (Signed)
This note was copied from a baby's chart. ?Lactation Consultation Note ? ?Patient Name: Lauren Schroeder ?Today's Date: 03/07/2022 ?Reason for consult: Follow-up assessment;Term ?Age:39 hours ? ? ?LC Follow Up Consult: ? ?RN requested latch assistance/observation ? ?Mother ready to latch "[name]" when I arrived.  Allowed mother to independently latch; few helpful tips given.  Mother is able to easily express colostrum and breasts are full today.  Encouraged to continue hand expression and to feed back any EBM she obtains to baby.  Suggested mother may want to pump some after discharge and supplement "[name]."  Mother interested in doing this.  Observed him feeding for 10 minutes with intermittent swallows; he appears gassy.  Parents report that he has been quite gassy since birth.  Observed mother burping well. ? ?Answered all questions; family ready for discharge.  RN updated. ? ? ?Maternal Data ?  ? ?Feeding ?Mother's Current Feeding Choice: Breast Milk ? ?LATCH Score ?Latch: Grasps breast easily, tongue down, lips flanged, rhythmical sucking. ? ?Audible Swallowing: Spontaneous and intermittent ? ?Type of Nipple: Everted at rest and after stimulation ? ?Comfort (Breast/Nipple): Soft / non-tender ? ?Hold (Positioning): Assistance needed to correctly position infant at breast and maintain latch. ? ?LATCH Score: 9 ? ? ?Lactation Tools Discussed/Used ?  ? ?Interventions ?Interventions: Breast feeding basics reviewed;Assisted with latch;Skin to skin;Breast massage;Hand express;Breast compression;Expressed milk;Position options;Support pillows;Adjust position;Education ? ?Discharge ?Discharge Education: Engorgement and breast care ? ?Consult Status ?Consult Status: Complete ?Date: 03/07/22 ?Follow-up type: Call as needed ? ? ? ?Josmar Messimer R Hafsah Hendler ?03/07/2022, 10:59 AM ? ? ? ?

## 2022-03-08 LAB — SURGICAL PATHOLOGY

## 2022-03-15 ENCOUNTER — Telehealth (HOSPITAL_COMMUNITY): Payer: Self-pay | Admitting: *Deleted

## 2022-03-15 DIAGNOSIS — R300 Dysuria: Secondary | ICD-10-CM | POA: Diagnosis not present

## 2022-03-15 NOTE — Telephone Encounter (Signed)
Mom reports feeling good. No concerns about herself at this time. EPDS=5 [facility] score=1) ?Mom reports baby is doing well. Feeding, peeing, and pooping without difficulty. Safe sleep reviewed. Mom reports no concerns about baby at present. ? ?Duffy Rhody, RN 03-15-2022 at 2:57pm ?

## 2022-03-22 DIAGNOSIS — R339 Retention of urine, unspecified: Secondary | ICD-10-CM | POA: Diagnosis not present

## 2022-03-22 DIAGNOSIS — N390 Urinary tract infection, site not specified: Secondary | ICD-10-CM | POA: Diagnosis not present

## 2022-04-15 DIAGNOSIS — Z1389 Encounter for screening for other disorder: Secondary | ICD-10-CM | POA: Diagnosis not present

## 2022-04-15 DIAGNOSIS — Z3040 Encounter for surveillance of contraceptives, unspecified: Secondary | ICD-10-CM | POA: Diagnosis not present

## 2022-04-15 DIAGNOSIS — N898 Other specified noninflammatory disorders of vagina: Secondary | ICD-10-CM | POA: Diagnosis not present

## 2022-05-25 DIAGNOSIS — K5900 Constipation, unspecified: Secondary | ICD-10-CM | POA: Diagnosis not present

## 2022-05-25 DIAGNOSIS — M6281 Muscle weakness (generalized): Secondary | ICD-10-CM | POA: Diagnosis not present

## 2022-05-25 DIAGNOSIS — N393 Stress incontinence (female) (male): Secondary | ICD-10-CM | POA: Diagnosis not present

## 2022-06-08 DIAGNOSIS — K5900 Constipation, unspecified: Secondary | ICD-10-CM | POA: Diagnosis not present

## 2022-06-08 DIAGNOSIS — M6281 Muscle weakness (generalized): Secondary | ICD-10-CM | POA: Diagnosis not present

## 2022-06-08 DIAGNOSIS — N393 Stress incontinence (female) (male): Secondary | ICD-10-CM | POA: Diagnosis not present

## 2022-06-09 DIAGNOSIS — N200 Calculus of kidney: Secondary | ICD-10-CM | POA: Diagnosis not present

## 2022-06-15 DIAGNOSIS — N393 Stress incontinence (female) (male): Secondary | ICD-10-CM | POA: Diagnosis not present

## 2022-06-15 DIAGNOSIS — K5900 Constipation, unspecified: Secondary | ICD-10-CM | POA: Diagnosis not present

## 2022-06-15 DIAGNOSIS — M6281 Muscle weakness (generalized): Secondary | ICD-10-CM | POA: Diagnosis not present

## 2022-06-22 DIAGNOSIS — N393 Stress incontinence (female) (male): Secondary | ICD-10-CM | POA: Diagnosis not present

## 2022-06-22 DIAGNOSIS — M6281 Muscle weakness (generalized): Secondary | ICD-10-CM | POA: Diagnosis not present

## 2022-06-22 DIAGNOSIS — K5900 Constipation, unspecified: Secondary | ICD-10-CM | POA: Diagnosis not present

## 2022-06-29 DIAGNOSIS — N393 Stress incontinence (female) (male): Secondary | ICD-10-CM | POA: Diagnosis not present

## 2022-06-29 DIAGNOSIS — M6281 Muscle weakness (generalized): Secondary | ICD-10-CM | POA: Diagnosis not present

## 2022-06-29 DIAGNOSIS — K5900 Constipation, unspecified: Secondary | ICD-10-CM | POA: Diagnosis not present

## 2022-07-20 DIAGNOSIS — K5900 Constipation, unspecified: Secondary | ICD-10-CM | POA: Diagnosis not present

## 2022-07-20 DIAGNOSIS — N393 Stress incontinence (female) (male): Secondary | ICD-10-CM | POA: Diagnosis not present

## 2022-07-20 DIAGNOSIS — M6281 Muscle weakness (generalized): Secondary | ICD-10-CM | POA: Diagnosis not present

## 2022-07-28 NOTE — Progress Notes (Unsigned)
HPI: [name] is a 41 y.o. female, who is here today to establish care.  Former PCP: N/A Last preventive routine visit: Over a year ago. Gyn routine exam on 04/15/22.  Chronic medical problems: Anxiety,depression,OCD, nephrolithiasis,gestational diabetes.  Anxiety and mild depression, she has been on Celexa 40 mg daily for about 40 years. She wonders if there are better medications to help with symptoms. Dx'ed with anxiety at age 75. Follows with therapist q 2-3 weeks. Symptoms worse with pregnancies and usually back to baseline after a few months. Denies suicidal or homicidal thoughts.  She has a good family support. Lives with her husband and 3 children (5-3 yo,and 73 months old). Sleeps about 7 hours, interrupted due to her youngest son sleep pattern.  Father with hx of bipolar and depression.     07/30/2022    8:15 AM 07/30/2022    8:14 AM 07/25/2019   11:02 AM  Depression screen PHQ 2/9  Decreased Interest 0 0 0  Down, Depressed, Hopeless 0 0 0  PHQ - 2 Score 0 0 0  Altered sleeping 1    Tired, decreased energy 1    Change in appetite 1    Feeling bad or failure about yourself  0    Trouble concentrating 1    Moving slowly or fidgety/restless 0    Suicidal thoughts 0    PHQ-9 Score 4    Difficult doing work/chores Not difficult at all        07/30/2022    5:53 PM  GAD 7 : Generalized Anxiety Score  Nervous, Anxious, on Edge 1  Control/stop worrying 1  Worry too much - different things 1  Trouble relaxing 3  Restless 2  Easily annoyed or irritable 1  Afraid - awful might happen 1  Total GAD 7 Score 10  Anxiety Difficulty Not difficult at all   IBS diarrhea, diagnosed years ago.  She does not have diarrhea daily, she has been able to manage symptoms with dietary changes.  Diarrhea is exacerbated by stress. Negative for fever, abnormal weight loss, blood in the stool, or melena. She wonders if there are new medications to help with symptoms.  Review of  Systems  Constitutional:  Positive for fatigue. Negative for activity change and appetite change.  HENT:  Negative for mouth sores, nosebleeds, sore throat and trouble swallowing.   Respiratory:  Negative for cough, shortness of breath and wheezing.   Cardiovascular:  Negative for chest pain, palpitations and leg swelling.  Gastrointestinal:  Negative for abdominal pain, nausea and vomiting.       Negative for changes in bowel habits.  Endocrine: Negative for cold intolerance and heat intolerance.  Genitourinary:  Negative for decreased urine volume and hematuria.  Neurological:  Negative for syncope, weakness and headaches.  Psychiatric/Behavioral:  Negative for confusion and hallucinations.   Rest see pertinent positives and negatives per HPI.  Current Outpatient Medications on File Prior to Visit  Medication Sig Dispense Refill   cetirizine (ZYRTEC) 10 MG tablet Take 10 mg by mouth at bedtime.      citalopram (CELEXA) 40 MG tablet Take 40 mg by mouth at bedtime.     norethindrone (MICRONOR) 0.35 MG tablet Take 1 tablet by mouth daily.     Prenatal Vit-Fe Fumarate-FA (PRENATAL MULTIVITAMIN) TABS tablet Take 1 tablet by mouth at bedtime.     No current facility-administered medications on file prior to visit.   Past Medical History:  Diagnosis Date   Anxiety  mild   Depression    mild   Gestational diabetes    IBS (irritable bowel syndrome)    IBS (irritable bowel syndrome)    Medical history non-contributory    Allergies  Allergen Reactions   Amoxicillin Hives    Has patient had a PCN reaction causing immediate rash, facial/tongue/throat swelling, SOB or lightheadedness with hypotension: unknown Has patient had a PCN reaction causing severe rash involving mucus membranes or skin necrosis: unknown Has patient had a PCN reaction that required hospitalization No Has patient had a PCN reaction occurring within the last 10 years: No If all of the above answers are "NO", then  may proceed with Cephalosporin use.    Lactose Intolerance (Gi) Other (See Comments)    GI Upset   History reviewed. No pertinent family history.  Social History   Socioeconomic History   Marital status: Married    Spouse name: Not on file   Number of children: 1   Years of education: Not on file   Highest education level: Not on file  Occupational History   Not on file  Tobacco Use   Smoking status: Never   Smokeless tobacco: Never  Vaping Use   Vaping Use: Never used  Substance and Sexual Activity   Alcohol use: Not Currently   Drug use: No   Sexual activity: Yes  Other Topics Concern   Not on file  Social History Narrative   Not on file   Social Determinants of Health   Financial Resource Strain: Not on file  Food Insecurity: Not on file  Transportation Needs: Not on file  Physical Activity: Not on file  Stress: Not on file  Social Connections: Not on file   Vitals:   07/30/22 0809  BP: 120/70  Pulse: 96  Resp: 12  SpO2: 98%   Body mass index is 24.42 kg/m.  Physical Exam Vitals and nursing note reviewed.  Constitutional:      General: She is not in acute distress.    Appearance: She is well-developed and well-groomed.  HENT:     Head: Normocephalic and atraumatic.     Mouth/Throat:     Mouth: Mucous membranes are moist.  Eyes:     Conjunctiva/sclera: Conjunctivae normal.  Cardiovascular:     Rate and Rhythm: Normal rate and regular rhythm.     Pulses:          Dorsalis pedis pulses are 2+ on the right side and 2+ on the left side.     Heart sounds: No murmur heard. Pulmonary:     Effort: Pulmonary effort is normal. No respiratory distress.     Breath sounds: Normal breath sounds.  Abdominal:     Palpations: Abdomen is soft. There is no hepatomegaly or mass.     Tenderness: There is no abdominal tenderness.  Lymphadenopathy:     Cervical: No cervical adenopathy.  Skin:    General: Skin is warm.     Findings: No erythema or rash.   Neurological:     General: No focal deficit present.     Mental Status: She is alert and oriented to person, place, and time.     Cranial Nerves: No cranial nerve deficit.     Gait: Gait normal.  Psychiatric:     Comments: Well groomed, good eye contact.   ASSESSMENT AND PLAN:  [name] was seen today for establish care.  Diagnoses and all orders for this visit:  Irritable bowel syndrome with diarrhea Problem seems to be  stable. We discussed prognosis and treatment options. For now I do not think GI work-up is needed at this time, she agrees with holding on GI referral.  GAD (generalized anxiety disorder) In general problem is stable, it seems to be exacerbated during and after each pregnancy. We discussed other pharmacologic options, including different SSRIs and SNRI's. She prefers to continue Celexa 40 mg daily. Continue CBT. Once she stops breast-feeding we could consider adding benzodiazepine to use as needed. Follow-up in 3 months, before if needed.  URI, acute Most likely viral and improving. She would like a COVID-19 test done, it was negative. Continue adequate hydration. Monitor for new symptoms. Follow-up as needed.  Return in about 3 months (around 10/29/2022).  Derreck Wiltsey G. [place], MD  [facility]. [place] office.

## 2022-07-30 ENCOUNTER — Encounter: Payer: Self-pay | Admitting: Family Medicine

## 2022-07-30 ENCOUNTER — Ambulatory Visit (INDEPENDENT_AMBULATORY_CARE_PROVIDER_SITE_OTHER): Payer: BC Managed Care – PPO | Admitting: Family Medicine

## 2022-07-30 VITALS — BP 120/70 | HR 96 | Resp 12 | Ht 61.0 in | Wt 129.2 lb

## 2022-07-30 DIAGNOSIS — K580 Irritable bowel syndrome with diarrhea: Secondary | ICD-10-CM | POA: Diagnosis not present

## 2022-07-30 DIAGNOSIS — J069 Acute upper respiratory infection, unspecified: Secondary | ICD-10-CM | POA: Diagnosis not present

## 2022-07-30 DIAGNOSIS — F411 Generalized anxiety disorder: Secondary | ICD-10-CM | POA: Diagnosis not present

## 2022-07-30 NOTE — Patient Instructions (Addendum)
A few things to remember from today's visit:  Irritable bowel syndrome with diarrhea  GAD (generalized anxiety disorder)  URI, acute  If you need refills for medications you take chronically, please call your pharmacy. Do not use My Chart to request refills or for acute issues that need immediate attention. If you send a my chart message, it may take a few days to be addressed, specially if I am not in the office.  Please be sure medication list is accurate. If a new problem present, please set up appointment sooner than planned today.  Continue Celexa and let me know if you decide to change to a different medication. Continue therapy. Monitor for fever or new respiratory symptoms. Allergies can also be contributing to symptoms.

## 2022-07-30 NOTE — Assessment & Plan Note (Signed)
Problem seems to be stable. We discussed prognosis and treatment options. For now I do not think GI work-up is needed at this time, she agrees with holding on GI referral.

## 2022-07-30 NOTE — Assessment & Plan Note (Signed)
In general problem is stable, it seems to be exacerbated during and after each pregnancy. We discussed other pharmacologic options, including different SSRIs and SNRI's. She prefers to continue Celexa 40 mg daily. Continue CBT. Once she stops breast-feeding we could consider adding benzodiazepine to use as needed. Follow-up in 3 months, before if needed.

## 2022-08-03 DIAGNOSIS — K5900 Constipation, unspecified: Secondary | ICD-10-CM | POA: Diagnosis not present

## 2022-08-03 DIAGNOSIS — M6281 Muscle weakness (generalized): Secondary | ICD-10-CM | POA: Diagnosis not present

## 2022-08-03 DIAGNOSIS — N393 Stress incontinence (female) (male): Secondary | ICD-10-CM | POA: Diagnosis not present

## 2022-08-10 DIAGNOSIS — M6281 Muscle weakness (generalized): Secondary | ICD-10-CM | POA: Diagnosis not present

## 2022-08-10 DIAGNOSIS — K5900 Constipation, unspecified: Secondary | ICD-10-CM | POA: Diagnosis not present

## 2022-08-10 DIAGNOSIS — N393 Stress incontinence (female) (male): Secondary | ICD-10-CM | POA: Diagnosis not present

## 2022-08-17 DIAGNOSIS — K5900 Constipation, unspecified: Secondary | ICD-10-CM | POA: Diagnosis not present

## 2022-08-17 DIAGNOSIS — N393 Stress incontinence (female) (male): Secondary | ICD-10-CM | POA: Diagnosis not present

## 2022-08-17 DIAGNOSIS — M6281 Muscle weakness (generalized): Secondary | ICD-10-CM | POA: Diagnosis not present

## 2022-08-30 IMAGING — CT CT RENAL STONE PROTOCOL
4 series · 13 of 46 positions shown, 18 images · non-contrast
Comparison: Ultrasound 01/24/2022

CLINICAL DATA: Flank pain, kidney stone suspected. Laterality is
not indicated. The patient is pregnant.



[Series 3: ap without · axial · non-contrast · 0.81mm/px · z∈[+904,+1244]mm · 7 of 92 slices shown, 12 images]
[im 12/92  soft-tissue]
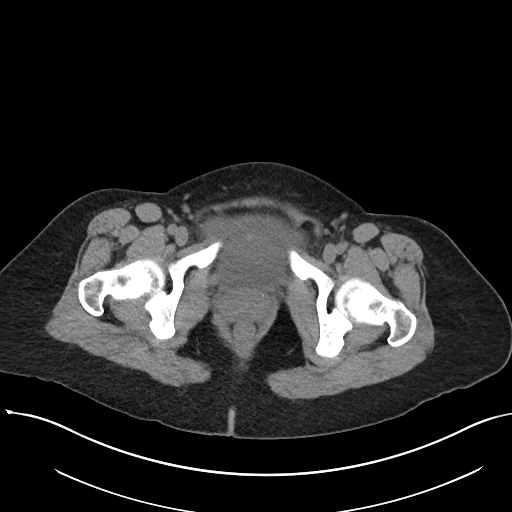
[im 12/92  bone]
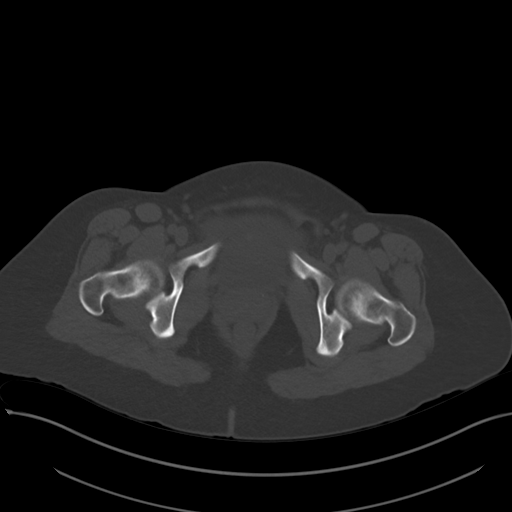
[im 23/92  soft-tissue]
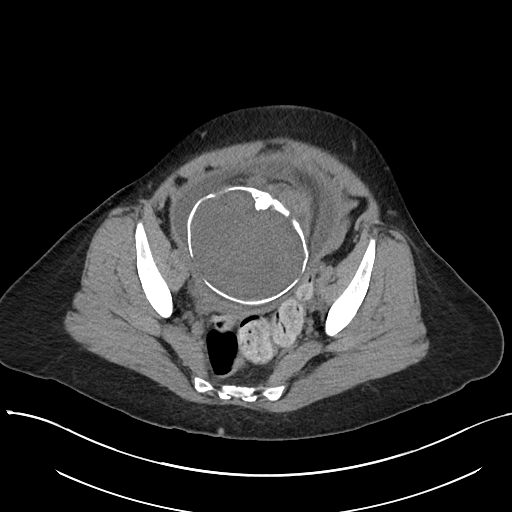
[im 35/92  soft-tissue]
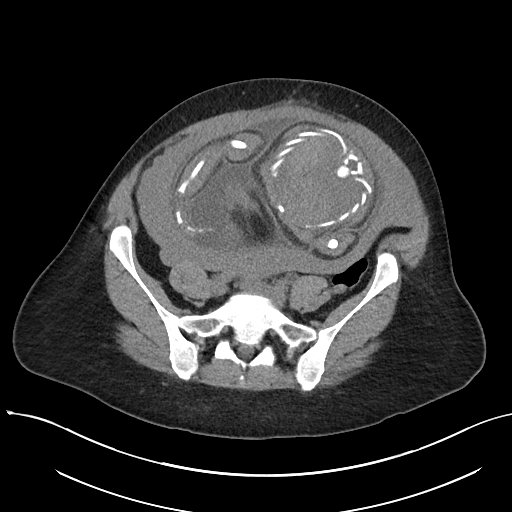
[im 46/92  soft-tissue]
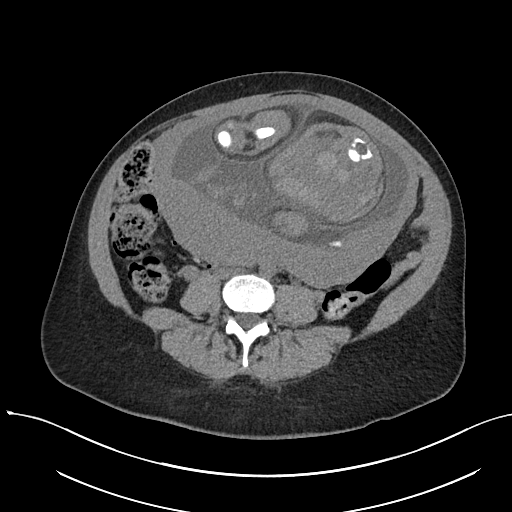
[im 46/92  lung]
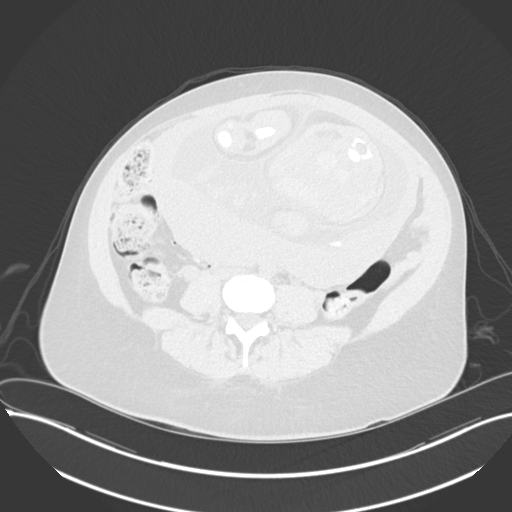
[im 57/92  soft-tissue]
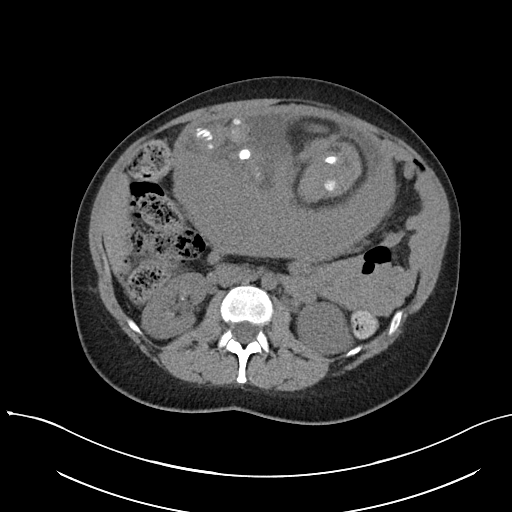
[im 57/92  lung]
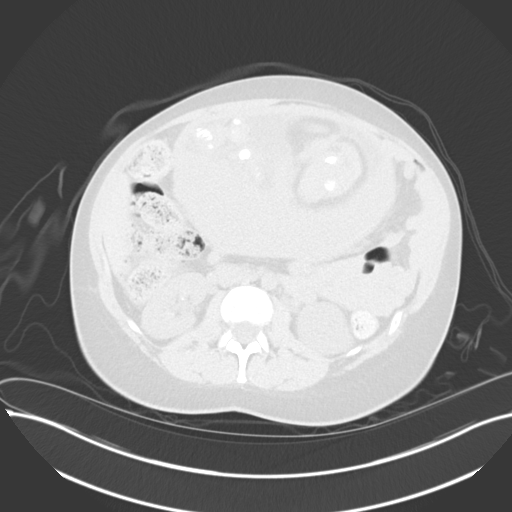
[im 69/92  soft-tissue]
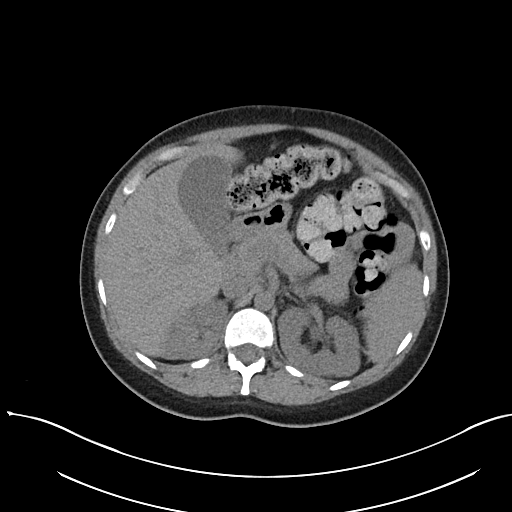
[im 69/92  lung]
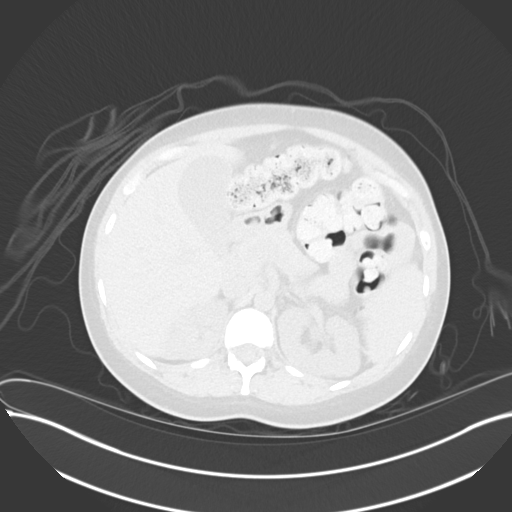
[im 80/92  soft-tissue]
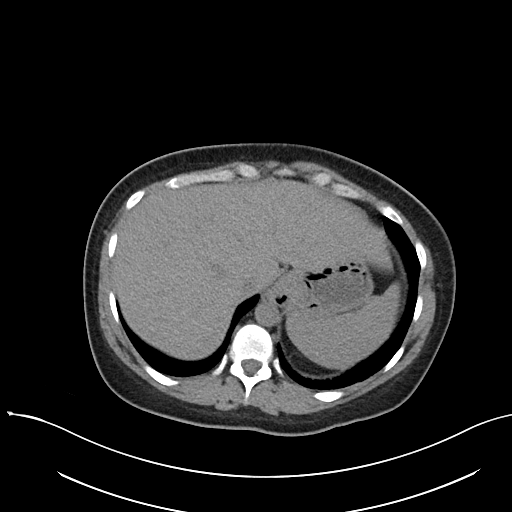
[im 80/92  lung]
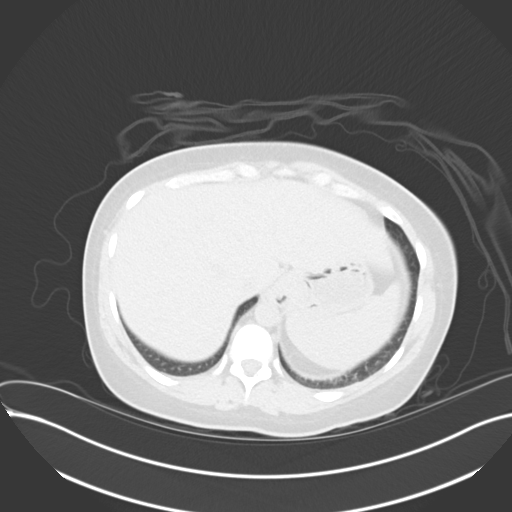

[Series 5: lungs · axial · 0.81mm/px · z∈[+888,+930]mm · 2 of 229 slices shown]
[im 21/229  soft-tissue]
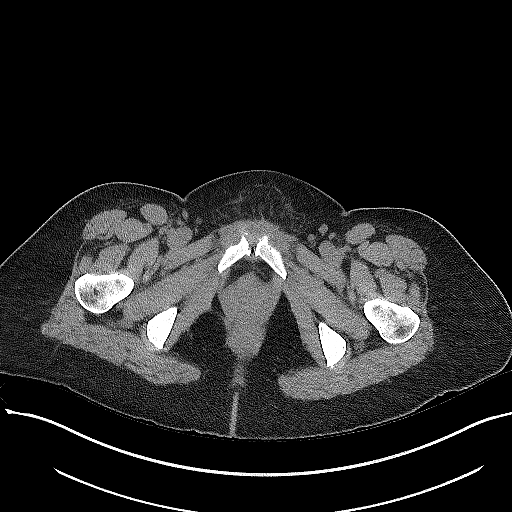
[im 42/229  soft-tissue]
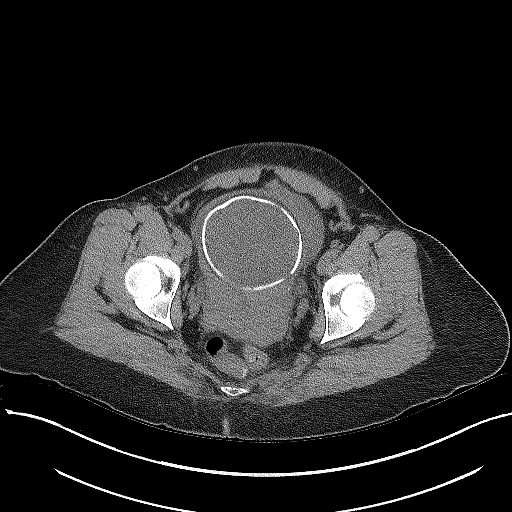

[Series 6: cor · coronal · 0.86mm/px · 3 of 103 slices shown]
[im 35/103  soft-tissue]
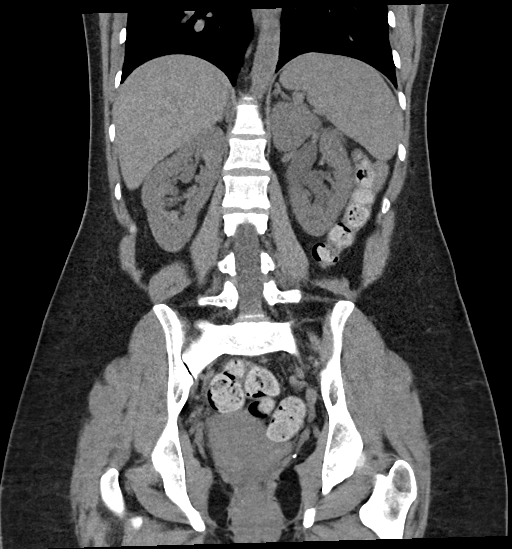
[im 46/103  soft-tissue]
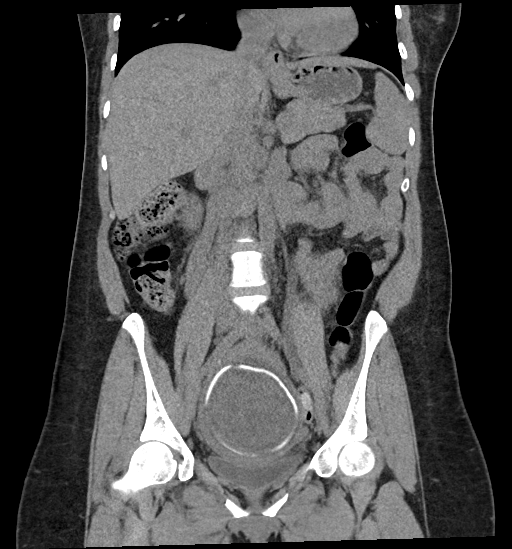
[im 57/103  soft-tissue]
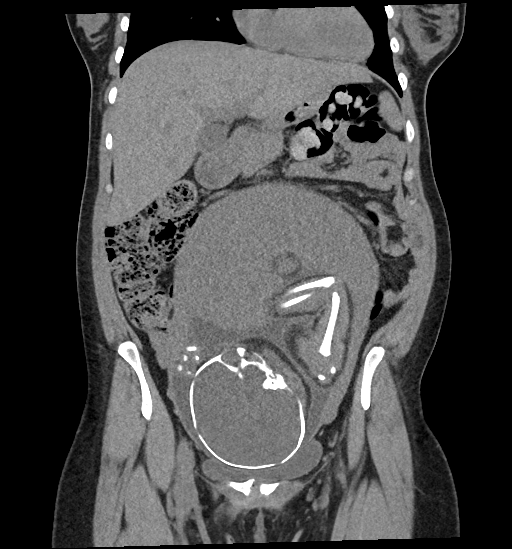

[Series 7: sag · sagittal · 0.60mm/px · 1 of 146 slices shown]
[im 49/146  soft-tissue]
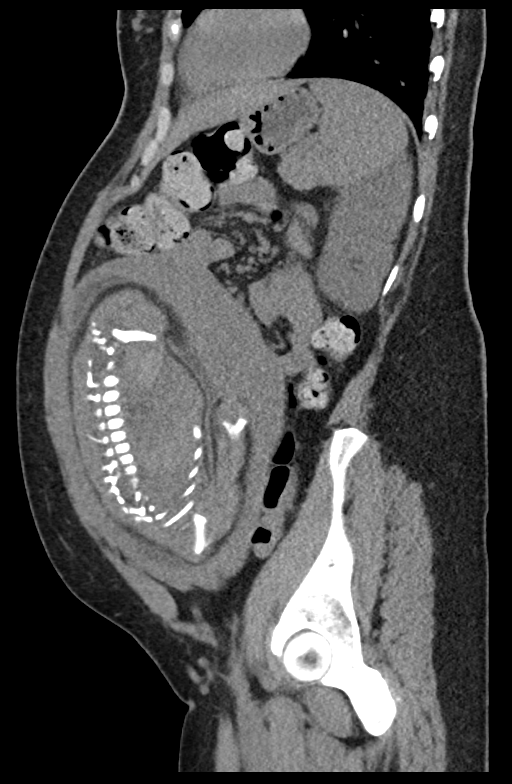

[13 of 46 positions shown; findings below may reference images not displayed]

FINDINGS: Lower chest: The lung bases are clear. Small esophageal hiatal
hernia.

Hepatobiliary: No focal liver abnormality is seen. No gallstones,
gallbladder wall thickening, or biliary dilatation.

Pancreas: Unremarkable. No pancreatic ductal dilatation or
surrounding inflammatory changes.

Spleen: Normal in size without focal abnormality.

Adrenals/Urinary Tract: No adrenal gland nodules. Bilateral
intrarenal stones, more numerous on the left. Largest stone on the
left measures 4 mm diameter. Bilateral hydronephrosis and
hydroureter extending to the bladder. No ureteral stones
demonstrated. Dilatation is likely due to extrinsic compression from
the pregnancy although the lower ureter is not well seen and occult
stone cannot be entirely excluded. Bladder is decompressed but
unremarkable.

Stomach/Bowel: Stomach, small bowel, and colon are not abnormally
distended. Stool fills the colon. No wall thickening or inflammatory
changes. Appendix is normal.

Vascular/Lymphatic: No significant vascular findings are present. No
enlarged abdominal or pelvic lymph nodes.

Reproductive: An intrauterine pregnancy is present. The fetus is in
cephalic presentation with spine to the maternal left side. Placenta
is posterior and fundal. Ovaries are not specifically identified but
no abnormal adnexal masses are seen.

Other: No free air or free fluid in the abdomen. Abdominal wall
musculature appears intact.

Musculoskeletal: No acute or significant osseous findings.
IMPRESSION: 1. Bilateral nonobstructing intrarenal stones.
2. Bilateral hydronephrosis and hydroureter to the level of the
bladder. No obstructing ureteral stones are identified.
3. Intrauterine pregnancy.

## 2022-09-08 DIAGNOSIS — M62838 Other muscle spasm: Secondary | ICD-10-CM | POA: Diagnosis not present

## 2022-09-16 DIAGNOSIS — M62838 Other muscle spasm: Secondary | ICD-10-CM | POA: Diagnosis not present

## 2022-09-22 DIAGNOSIS — M62838 Other muscle spasm: Secondary | ICD-10-CM | POA: Diagnosis not present

## 2022-09-29 DIAGNOSIS — M62838 Other muscle spasm: Secondary | ICD-10-CM | POA: Diagnosis not present

## 2022-10-14 DIAGNOSIS — M62838 Other muscle spasm: Secondary | ICD-10-CM | POA: Diagnosis not present

## 2023-03-21 ENCOUNTER — Other Ambulatory Visit: Payer: Self-pay | Admitting: Family

## 2023-03-21 DIAGNOSIS — N6313 Unspecified lump in the right breast, lower outer quadrant: Secondary | ICD-10-CM

## 2023-03-28 ENCOUNTER — Ambulatory Visit
Admission: RE | Admit: 2023-03-28 | Discharge: 2023-03-28 | Disposition: A | Discharge status: Home / Self Care | Payer: No Typology Code available for payment source | Source: Ambulatory Visit | Attending: Family | Admitting: Family

## 2023-03-28 ENCOUNTER — Ambulatory Visit
Admission: RE | Admit: 2023-03-28 | Discharge: 2023-03-28 | Disposition: A | Discharge status: Home / Self Care | Payer: BC Managed Care – PPO | Source: Ambulatory Visit | Attending: Family | Admitting: Family

## 2023-03-28 ENCOUNTER — Other Ambulatory Visit: Payer: Self-pay | Admitting: Family

## 2023-03-28 DIAGNOSIS — N6310 Unspecified lump in the right breast, unspecified quadrant: Secondary | ICD-10-CM

## 2023-03-28 DIAGNOSIS — N6313 Unspecified lump in the right breast, lower outer quadrant: Secondary | ICD-10-CM

## 2023-04-07 ENCOUNTER — Ambulatory Visit: Payer: No Typology Code available for payment source | Admitting: Family Medicine

## 2023-09-30 ENCOUNTER — Ambulatory Visit
Admission: RE | Admit: 2023-09-30 | Discharge: 2023-09-30 | Disposition: A | Discharge status: Home / Self Care | Payer: No Typology Code available for payment source | Source: Ambulatory Visit | Attending: Family | Admitting: Family

## 2023-09-30 ENCOUNTER — Other Ambulatory Visit: Payer: Self-pay | Admitting: Family

## 2023-09-30 DIAGNOSIS — N6310 Unspecified lump in the right breast, unspecified quadrant: Secondary | ICD-10-CM

## 2023-12-21 ENCOUNTER — Ambulatory Visit: Payer: No Typology Code available for payment source | Admitting: Family Medicine

## 2023-12-21 VITALS — BP 92/60 | HR 82 | Temp 98.3°F | Wt 131.9 lb

## 2023-12-21 DIAGNOSIS — J069 Acute upper respiratory infection, unspecified: Secondary | ICD-10-CM | POA: Diagnosis not present

## 2023-12-21 NOTE — Progress Notes (Signed)
 Established Patient Office Visit  Subjective   Patient ID: Lauren Schroeder, female    DOB: 05-27-1981  Age: 43 y.o. MRN: [id number]  Chief Complaint  Patient presents with   Cough    Patient complains of productive cough, x1 week    Sinusitis    Patinet complains of sinusitis, x3 days    Chills    Patient complains of chills, x3 days     HPI   Lauren Schroeder seen as a work and with onset about a week ago some cough and a few days of sinus congestive symptoms and chills but no definite fever.  Lauren Schroeder has had some bodyaches especially for the past few days.  One of her children has had some vomiting earlier today.  Lauren Schroeder denies any chronic respiratory issues.  Lauren Schroeder denies any nausea, vomiting, or diarrhea.  No dyspnea.  Cough relatively stable.  Past Medical History:  Diagnosis Date   Anxiety    mild   Depression    mild   Gestational diabetes    IBS (irritable bowel syndrome)    IBS (irritable bowel syndrome)    Medical history non-contributory    Past Surgical History:  Procedure Laterality Date   CESAREAN SECTION N/A 12/18/2016   Procedure: CESAREAN SECTION;  Surgeon: Truman Corona, MD;  Location: [facility] BIRTHING SU[facility];  Service: Obstetrics;  Laterality: N/A;   DILATION AND EVACUATION N/A 11/16/2018   Procedure: DILATATION AND EVACUATION;  Surgeon: Curlene Agent, MD;  Location: [facility] ORS;  Service: Gynecology;  Laterality: N/A;    reports that Lauren Schroeder has never smoked. Lauren Schroeder has never used smokeless tobacco. Lauren Schroeder reports that Lauren Schroeder does not currently use alcohol. Lauren Schroeder reports that Lauren Schroeder does not use drugs. family history is not on file. Allergies  Allergen Reactions   Amoxicillin Hives    Has patient had a PCN reaction causing immediate rash, facial/tongue/throat swelling, SOB or lightheadedness with hypotension: unknown Has patient had a PCN reaction causing severe rash involving mucus membranes or skin necrosis: unknown Has patient had a PCN reaction that required hospitalization No Has patient had  a PCN reaction occurring within the last 10 years: No If all of the above answers are NO, then may proceed with Cephalosporin use.    Lactose Intolerance (Gi) Other (See Comments)    GI Upset    Review of Systems  Constitutional:  Positive for chills. Negative for fever.  HENT:  Positive for congestion.   Respiratory:  Positive for cough.   Cardiovascular:  Negative for chest pain.      Objective:     BP 92/60 (BP Location: Left Arm, Patient Position: Sitting, Cuff Size: Normal)   Pulse 82   Temp 98.3 F (36.8 C) (Oral)   Wt 131 lb 14.4 oz (59.8 kg)   SpO2 98%   BMI 24.92 kg/m  BP Readings from Last 3 Encounters:  12/21/23 92/60  07/30/22 120/70  03/07/22 (!) 110/53   Wt Readings from Last 3 Encounters:  12/21/23 131 lb 14.4 oz (59.8 kg)  07/30/22 129 lb 4 oz (58.6 kg)  01/24/22 156 lb 4.8 oz (70.9 kg)      Physical Exam Vitals reviewed.  Constitutional:      General: Lauren Schroeder is not in acute distress.    Appearance: Lauren Schroeder is not ill-appearing.  HENT:     Right Ear: Tympanic membrane normal.     Left Ear: Tympanic membrane normal.     Mouth/Throat:     Mouth: Mucous membranes are moist.  Pharynx: Oropharynx is clear. No oropharyngeal exudate or posterior oropharyngeal erythema.  Cardiovascular:     Rate and Rhythm: Normal rate and regular rhythm.  Pulmonary:     Effort: Pulmonary effort is normal.     Breath sounds: Normal breath sounds. No wheezing or rales.  Musculoskeletal:     Cervical back: Neck supple.  Lymphadenopathy:     Cervical: No cervical adenopathy.  Neurological:     Mental Status: Lauren Schroeder is alert.      No results found for any visits on 12/21/23.    The ASCVD Risk score (Arnett DK, et al., 2019) failed to calculate for the following reasons:   Cannot find a previous HDL lab   Cannot find a previous total cholesterol lab    Assessment & Plan:   Probable viral URI with cough.  Could possibly be influenza but Lauren Schroeder has not had any  documented fever and is past 48-hour window for treatment so we elected not to test for influenza.  Treat symptomatically with plenty fluids and rest and over-the-counter cough medications and analgesics as needed.  Follow-up for any persistent or worsening symptoms.  Reviewed predictors clinically of bacterial sinusitis.  No clear indication for antibiotics at this time. Wolm Scarlet, MD

## 2023-12-22 ENCOUNTER — Encounter: Payer: Self-pay | Admitting: Family Medicine

## 2023-12-26 MED ORDER — AZITHROMYCIN 250 MG PO TABS: ORAL_TABLET | ORAL | 0 refills | Status: AC

## 2024-01-04 LAB — HM PAP SMEAR: HM Pap smear: NORMAL

## 2024-03-22 ENCOUNTER — Encounter (HOSPITAL_COMMUNITY): Payer: Self-pay

## 2024-03-29 ENCOUNTER — Encounter

## 2024-03-29 ENCOUNTER — Inpatient Hospital Stay: Admission: RE | Admit: 2024-03-29 | Source: Ambulatory Visit

## 2024-04-25 ENCOUNTER — Ambulatory Visit
Admission: RE | Admit: 2024-04-25 | Discharge: 2024-04-25 | Disposition: A | Discharge status: Home / Self Care | Source: Ambulatory Visit | Attending: Family | Admitting: Family

## 2024-04-25 DIAGNOSIS — N6310 Unspecified lump in the right breast, unspecified quadrant: Secondary | ICD-10-CM

## 2024-05-07 ENCOUNTER — Ambulatory Visit: Payer: Self-pay | Admitting: Family Medicine

## 2024-05-07 ENCOUNTER — Ambulatory Visit (INDEPENDENT_AMBULATORY_CARE_PROVIDER_SITE_OTHER): Admitting: Family Medicine

## 2024-05-07 ENCOUNTER — Encounter: Payer: Self-pay | Admitting: Family Medicine

## 2024-05-07 VITALS — BP 100/60 | HR 88 | Resp 12 | Ht 61.0 in | Wt 134.0 lb

## 2024-05-07 DIAGNOSIS — Z130 Encounter for screening for diseases of the blood and blood-forming organs and certain disorders involving the immune mechanism: Secondary | ICD-10-CM | POA: Diagnosis not present

## 2024-05-07 DIAGNOSIS — Z13228 Encounter for screening for other metabolic disorders: Secondary | ICD-10-CM | POA: Diagnosis not present

## 2024-05-07 DIAGNOSIS — F411 Generalized anxiety disorder: Secondary | ICD-10-CM

## 2024-05-07 DIAGNOSIS — Z0000 Encounter for general adult medical examination without abnormal findings: Secondary | ICD-10-CM | POA: Diagnosis not present

## 2024-05-07 DIAGNOSIS — Z13220 Encounter for screening for lipoid disorders: Secondary | ICD-10-CM | POA: Diagnosis not present

## 2024-05-07 DIAGNOSIS — R599 Enlarged lymph nodes, unspecified: Secondary | ICD-10-CM | POA: Diagnosis not present

## 2024-05-07 DIAGNOSIS — Z1329 Encounter for screening for other suspected endocrine disorder: Secondary | ICD-10-CM

## 2024-05-07 LAB — LIPID PANEL
Cholesterol: 160 mg/dL (ref 0–200)
HDL: 41.7 mg/dL (ref >39.00)
LDL Cholesterol: 91 mg/dL (ref 0–99)
NonHDL: 118.58
Total CHOL/HDL Ratio: 4
Triglycerides: 136 mg/dL (ref 0.0–149.0)
VLDL: 27.2 mg/dL (ref 0.0–40.0)

## 2024-05-07 LAB — CBC
HCT: 40.5 % (ref 36.0–46.0)
Hemoglobin: 13.8 g/dL (ref 12.0–15.0)
MCHC: 34.2 g/dL (ref 30.0–36.0)
MCV: 91 fl (ref 78.0–100.0)
Platelets: 292 10*3/uL (ref 150.0–400.0)
RBC: 4.45 Mil/uL (ref 3.87–5.11)
RDW: 12.1 % (ref 11.5–15.5)
WBC: 5.1 10*3/uL (ref 4.0–10.5)

## 2024-05-07 LAB — COMPREHENSIVE METABOLIC PANEL WITH GFR
ALT: 15 U/L (ref 0–35)
AST: 17 U/L (ref 0–37)
Albumin: 4.3 g/dL (ref 3.5–5.2)
Alkaline Phosphatase: 45 U/L (ref 39–117)
BUN: 12 mg/dL (ref 6–23)
CO2: 29 meq/L (ref 19–32)
Calcium: 9.2 mg/dL (ref 8.4–10.5)
Chloride: 102 meq/L (ref 96–112)
Creatinine, Ser: 0.77 mg/dL (ref 0.40–1.20)
GFR: 95.96 mL/min (ref >60.00)
Glucose, Bld: 68 mg/dL — ABNORMAL LOW (ref 70–99)
Potassium: 4.2 meq/L (ref 3.5–5.1)
Sodium: 139 meq/L (ref 135–145)
Total Bilirubin: 0.6 mg/dL (ref 0.2–1.2)
Total Protein: 6.6 g/dL (ref 6.0–8.3)

## 2024-05-07 LAB — HEMOGLOBIN A1C: Hgb A1c MFr Bld: 5.2 % (ref 4.6–6.5)

## 2024-05-07 MED ORDER — HYDROXYZINE HCL 25 MG PO TABS: 12.5000 mg | ORAL_TABLET | Freq: Three times a day (TID) | ORAL | 2 refills | Status: AC | PRN

## 2024-05-07 MED ORDER — CITALOPRAM HYDROBROMIDE 40 MG PO TABS: 40.0000 mg | ORAL_TABLET | Freq: Every day | ORAL | 3 refills | Status: AC

## 2024-05-07 NOTE — Assessment & Plan Note (Signed)
 In general problem is stable She has been on Celexa  40 mg for several years, for now she would like to continue same dose and she is not interested in adding a daily new medication. She would like something she can use as needed for acute anxiety, recommend hydroxyzine 25 mg 1/2 to 1 tablet 3 times daily as needed.  We discussed son side effects. As far as problem is stable, we can continue annual follow-ups.

## 2024-05-07 NOTE — Progress Notes (Signed)
 HPI: Lauren Schroeder is a 43 y.o. female  with PMHx significant for anxiety and IBS who is here today for her routine physical.  Last CPE: Over a year ago. She follows regularly with her gynecologist.  Exercise: Weight training 2-3 months a week; not as often for the past couple weeks. Diet: Home cooked meals; vegetables daily.  Sleep: 8 hours  Smoking: Never  Alcohol consumption: Occasional; 1-2x/month Dental: UTD with routine dental care.  Vision: UTD with routine vision exams.   Chronic medical problems:  Anxiety / Depression / OCD Pt is on Celexa  40 mg daily.  Good compliant and tolerance  She has been taking Celexa  since she was 43 y/o.   Not currently following up with a therapist.  She has acute anxiety about 1-2 times per month, she is inquiring about medication she can take as needed.  Blood sugar issues Reports episodes of diaphoresis and shakiness.  This tends to occur when she has skipped meals.  She assumes it is related with low blood sugar, she has not checked it during episode. History of gestational diabetes. No associated CP or palpitations. States this has been ongoing her whole life but has recently had more frequent episodes.   Immunization History  Administered Date(s) Administered   Influenza,inj,Quad PF,6+ Mos 08/17/2019   Influenza,inj,quad, With Preservative 08/12/2021   Tdap 07/06/2019, 01/27/2022   Health Maintenance  Topic Date Due   COVID-19 Vaccine (1 - 2024-25 season) 05/23/2024 (Originally 07/17/2023)   HPV VACCINES (1 - 3-dose SCDM series) 05/07/2025 (Originally 02/22/2010)   INFLUENZA VACCINE  06/15/2024   Cervical Cancer Screening (HPV/Pap Cotest)  01/03/2027   DTaP/Tdap/Td (3 - Td or Tdap) 01/28/2032   Hepatitis C Screening  Completed   HIV Screening  Completed   Meningococcal B Vaccine  Aged Out   Swollen lymph nodes She reports lock jaw with associated pain about 1-2 weeks ago, no residual problem with ROM but some pain.Has  been taking Ibuprofen  to manage this pain.   She noted  a swollen lymph node left preauricular 4 days ago and yesterday left submandibular. Denies any lesions in mouth, earache, or sore throat.  Negative for associated weight loss or night sweats. Mentions that 1-2 weeks ago she had left forehead erythematous bumps, healing now. No vesicular lesions or tingling/numbness.  Review of Systems  Constitutional:  Negative for activity change, appetite change, chills and fever.  HENT:  Negative for ear discharge, mouth sores, sore throat and trouble swallowing.   Eyes:  Negative for redness and visual disturbance.  Respiratory:  Negative for cough, shortness of breath and wheezing.   Cardiovascular:  Negative for chest pain and leg swelling.  Gastrointestinal:  Negative for abdominal pain, nausea and vomiting.  Endocrine: Negative for cold intolerance, heat intolerance, polydipsia, polyphagia and polyuria.  Genitourinary:  Negative for decreased urine volume, dysuria and hematuria.  Musculoskeletal:  Negative for gait problem and myalgias.  Skin:  Negative for color change.  Allergic/Immunologic: Positive for environmental allergies.  Neurological:  Negative for syncope, weakness and headaches.  Hematological:  Positive for adenopathy. Does not bruise/bleed easily.  Psychiatric/Behavioral:  Negative for confusion and hallucinations. The patient is nervous/anxious.   All other systems reviewed and are negative.  Current Outpatient Medications on File Prior to Visit  Medication Sig Dispense Refill   cetirizine (ZYRTEC) 10 MG tablet Take 10 mg by mouth at bedtime.      citalopram  (CELEXA ) 40 MG tablet Take 40 mg by mouth at bedtime.  LO LOESTRIN FE 1 MG-10 MCG / 10 MCG tablet Take 1 tablet by mouth daily.     No current facility-administered medications on file prior to visit.   Past Medical History:  Diagnosis Date   Allergy    Anxiety    mild   Depression    mild   Gestational  diabetes    IBS (irritable bowel syndrome)    IBS (irritable bowel syndrome)    Medical history non-contributory     Past Surgical History:  Procedure Laterality Date   CESAREAN SECTION N/A 12/18/2016   Procedure: CESAREAN SECTION;  Surgeon: Truman Corona, MD;  Location: [facility]THING SU[facility];  Service: Obstetrics;  Laterality: N/A;   DILATION AND EVACUATION N/A 11/16/2018   Procedure: DILATATION AND EVACUATION;  Surgeon: Curlene Agent, MD;  Location: [facility] ORS;  Service: Gynecology;  Laterality: N/A;    Allergies  Allergen Reactions   Amoxicillin Hives    Has patient had a PCN reaction causing immediate rash, facial/tongue/throat swelling, SOB or lightheadedness with hypotension: unknown Has patient had a PCN reaction causing severe rash involving mucus membranes or skin necrosis: unknown Has patient had a PCN reaction that required hospitalization No Has patient had a PCN reaction occurring within the last 10 years: No If all of the above answers are NO, then may proceed with Cephalosporin use.    Lactose Intolerance (Gi) Other (See Comments)    GI Upset    Family History  Problem Relation Age of Onset   Miscarriages / [place] Mother    Depression Father    Cancer Paternal Grandfather    Depression Paternal Grandmother    Hearing loss Paternal Uncle    Miscarriages / Stillbirths Maternal Aunt     Social History   Socioeconomic History   Marital status: Married    Spouse name: Not on file   Number of children: 1   Years of education: Not on file   Highest education level: Bachelor's degree (e.g., BA, AB, BS)  Occupational History   Not on file  Tobacco Use   Smoking status: Never   Smokeless tobacco: Never  Vaping Use   Vaping status: Never Used  Substance and Sexual Activity   Alcohol use: Yes    Alcohol/week: 2.0 standard drinks of alcohol    Types: 1 Glasses of wine, 1 Shots of liquor per week   Drug use: No   Sexual activity: Yes    Birth  control/protection: Pill  Other Topics Concern   Not on file  Social History Narrative   Not on file   Social Drivers of Health   Financial Resource Strain: Low Risk  (05/06/2024)   Overall Financial Resource Strain (CARDIA)    Difficulty of Paying Living Expenses: Not hard at all  Food Insecurity: No Food Insecurity (05/06/2024)   Hunger Vital Sign    Worried About Running Out of Food in the Last Year: Never true    Ran Out of Food in the Last Year: Never true  Transportation Needs: No Transportation Needs (05/06/2024)   PRAPARE - Transportation    Lack of Transportation (Medical): No    Lack of Transportation (Non-Medical): No  Physical Activity: Insufficiently Active (05/06/2024)   Exercise Vital Sign    Days of Exercise per Week: 2 days    Minutes of Exercise per Session: 60 min  Stress: Stress Concern Present (05/06/2024)   [employer] of Occupational Health - Occupational Stress Questionnaire    Feeling of Stress: To some extent  Social  Connections: Socially Integrated (05/06/2024)   Social Connection and Isolation Panel    Frequency of Communication with Friends and Family: More than three times a week    Frequency of Social Gatherings with Friends and Family: Once a week    Attends Religious Services: More than 4 times per year    Active Member of [employer] or Organizations: Yes    Attends Banker Meetings: More than 4 times per year    Marital Status: Married   Vitals:   05/07/24 0805  BP: 100/60  Pulse: 88  Resp: 12  SpO2: 98%   Body mass index is 25.32 kg/m.  Wt Readings from Last 3 Encounters:  05/07/24 134 lb (60.8 kg)  12/21/23 131 lb 14.4 oz (59.8 kg)  07/30/22 129 lb 4 oz (58.6 kg)   Physical Exam Vitals and nursing note reviewed.  Constitutional:      General: She is not in acute distress.    Appearance: She is well-developed.  HENT:     Head: Normocephalic and atraumatic.     Right Ear: Tympanic membrane, ear canal and external ear  normal.     Left Ear: Tympanic membrane, ear canal and external ear normal.     Mouth/Throat:     Mouth: Mucous membranes are moist.     Pharynx: Oropharynx is clear. Uvula midline.   Eyes:     Extraocular Movements: Extraocular movements intact.     Conjunctiva/sclera: Conjunctivae normal.     Pupils: Pupils are equal, round, and reactive to light.   Neck:     Thyroid: No thyroid mass or thyromegaly.   Cardiovascular:     Rate and Rhythm: Normal rate and regular rhythm.     Pulses:          Dorsalis pedis pulses are 2+ on the right side and 2+ on the left side.     Heart sounds: No murmur heard. Pulmonary:     Effort: Pulmonary effort is normal. No respiratory distress.     Breath sounds: Normal breath sounds.  Abdominal:     Palpations: Abdomen is soft. There is no hepatomegaly or mass.     Tenderness: There is no abdominal tenderness.  Genitourinary:    Comments: Deferred to gyn.  Musculoskeletal:     Right lower leg: No edema.     Left lower leg: No edema.     Comments: No major deformity or signs of synovitis appreciated.  Lymphadenopathy:     Head:     Right side of head: No submandibular or preauricular adenopathy.     Left side of head: Submandibular (< 1 cm, not tender) and preauricular (about 1 cm, mobile, not tender) adenopathy present.     Cervical: No cervical adenopathy.     Upper Body:     Right upper body: No supraclavicular adenopathy.     Left upper body: No supraclavicular adenopathy.     Comments: ,    Skin:    General: Skin is warm.     Findings: No erythema or rash.       Neurological:     General: No focal deficit present.     Mental Status: She is alert and oriented to person, place, and time.     Cranial Nerves: No cranial nerve deficit.     Coordination: Coordination normal.     Gait: Gait normal.     Deep Tendon Reflexes:     Reflex Scores:      Bicep reflexes  are 2+ on the right side and 2+ on the left side.      Patellar reflexes  are 2+ on the right side and 2+ on the left side.  Psychiatric:        Mood and Affect: Mood and affect normal.   ASSESSMENT AND PLAN: Ms. Lauren Schroeder was here today annual physical examination.  Orders Placed This Encounter  Procedures   CBC   Hemoglobin A1c   Lipid panel   Comprehensive metabolic panel with GFR   Lab Results  Component Value Date   HGBA1C 5.2 05/07/2024   Lab Results  Component Value Date   WBC 5.1 05/07/2024   HGB 13.8 05/07/2024   HCT 40.5 05/07/2024   MCV 91.0 05/07/2024   PLT 292.0 05/07/2024   Lab Results  Component Value Date   NA 139 05/07/2024   CL 102 05/07/2024   K 4.2 05/07/2024   CO2 29 05/07/2024   BUN 12 05/07/2024   CREATININE 0.77 05/07/2024   GFR 95.96 05/07/2024   CALCIUM 9.2 05/07/2024   ALBUMIN 4.3 05/07/2024   GLUCOSE 68 (L) 05/07/2024   Lab Results  Component Value Date   ALT 15 05/07/2024   AST 17 05/07/2024   ALKPHOS 45 05/07/2024   BILITOT 0.6 05/07/2024   Routine general medical examination at a health care facility Assessment & Plan: We discussed the importance of regular physical activity and healthy diet for prevention of chronic illness and/or complications. Preventive guidelines reviewed. Vaccination up-to-date. Continue her female preventive care with gynecologist. Next CPE in a year.  Adenopathy Left preauricular and submandibular. We discussed possible etiologies. Could be related to rash she reports having 1 to 2 weeks ago, which is already healing. Recommend continue monitoring for changes. Further recommendation will be given according to lab results.  -     CBC; Future -     Comprehensive metabolic panel with GFR; Future  Screening for lipoid disorders -     Lipid panel; Future  Screening for endocrine, metabolic and immunity disorder -     Hemoglobin A1c; Future  GAD (generalized anxiety disorder) Assessment & Plan: In general problem is stable She has been on Celexa  40 mg for several  years, for now she would like to continue same dose and she is not interested in adding a daily new medication. She would like something she can use as needed for acute anxiety, recommend hydroxyzine 25 mg 1/2 to 1 tablet 3 times daily as needed.  We discussed son side effects. As far as problem is stable, we can continue annual follow-ups.  Orders: -     hydrOXYzine HCl; Take 0.5-1 tablets (12.5-25 mg total) by mouth 3 (three) times daily as needed for anxiety.  Dispense: 30 tablet; Refill: 2  In regard to episodes of diaphoresis and shakiness, which seem to be triggered by skipping meals, recommend eating around-the-clock and protein snacks in between.  We discussed possible etiologies. She has a glucometer at home, recommend checking BS during episode. Continue monitoring for new symptoms. Continue adequate hydration. Follow-up as needed.  Return in 1 year (on 05/07/2025) for CPE, chronic problems.  I, Vernell Forest, acting as a scribe for Scotland Dost [place], MD., have documented all relevant documentation on the behalf of Lauren Toya [place], MD, as directed by   while in the presence of Lauren Kage [place], MD.  I, Zakari Bathe [place], MD, have reviewed all documentation for this visit. The documentation on 05/07/24 for the exam, diagnosis, procedures, and orders are all  accurate and complete.  Lauren Abruzzo G. [place], MD  [facility]. [place] office.

## 2024-05-07 NOTE — Patient Instructions (Addendum)
 A few things to remember from today's visit:  Adenopathy - Plan: CBC, Comprehensive metabolic panel with GFR  Routine general medical examination at a health care facility  Screening for lipoid disorders - Plan: Lipid panel  Screening for endocrine, metabolic and immunity disorder - Plan: Hemoglobin A1c  GAD (generalized anxiety disorder) - Plan: hydrOXYzine (ATARAX) 25 MG tablet  If you need refills for medications you take chronically, please call your pharmacy. Do not use My Chart to request refills or for acute issues that need immediate attention. If you send a my chart message, it may take a few days to be addressed, specially if I am not in the office.  Please be sure medication list is accurate. If a new problem present, please set up appointment sooner than planned today.  Monitor for lymph node changes. Rash on forehead could have been shingles. Check blood sugar when you have episodes of swelling and dizziness.  For anxiety no changes in Celexa . Hydroxyzine added today for acute anxiety episodes. Do not drive when taking it.  Health Maintenance, Female Adopting a healthy lifestyle and getting preventive care are important in promoting health and wellness. Ask your health care provider about: The right schedule for you to have regular tests and exams. Things you can do on your own to prevent diseases and keep yourself healthy. What should I know about diet, weight, and exercise? Eat a healthy diet  Eat a diet that includes plenty of vegetables, fruits, low-fat dairy products, and lean protein. Do not eat a lot of foods that are high in solid fats, added sugars, or sodium. Maintain a healthy weight Body mass index (BMI) is used to identify weight problems. It estimates body fat based on height and weight. Your health care provider can help determine your BMI and help you achieve or maintain a healthy weight. Get regular exercise Get regular exercise. This is one of the  most important things you can do for your health. Most adults should: Exercise for at least 150 minutes each week. The exercise should increase your heart rate and make you sweat (moderate-intensity exercise). Do strengthening exercises at least twice a week. This is in addition to the moderate-intensity exercise. Spend less time sitting. Even light physical activity can be beneficial. Watch cholesterol and blood lipids Have your blood tested for lipids and cholesterol at 41 years of age, then have this test every 5 years. Have your cholesterol levels checked more often if: Your lipid or cholesterol levels are high. You are older than 41 years of age. You are at high risk for heart disease. What should I know about cancer screening? Depending on your health history and family history, you may need to have cancer screening at various ages. This may include screening for: Breast cancer. Cervical cancer. Colorectal cancer. Skin cancer. Lung cancer. What should I know about heart disease, diabetes, and high blood pressure? Blood pressure and heart disease High blood pressure causes heart disease and increases the risk of stroke. This is more likely to develop in people who have high blood pressure readings or are overweight. Have your blood pressure checked: Every 3-5 years if you are 86-54 years of age. Every year if you are 8 years old or older. Diabetes Have regular diabetes screenings. This checks your fasting blood sugar level. Have the screening done: Once every three years after age 84 if you are at a normal weight and have a low risk for diabetes. More often and at a younger age  if you are overweight or have a high risk for diabetes. What should I know about preventing infection? Hepatitis B If you have a higher risk for hepatitis B, you should be screened for this virus. Talk with your health care provider to find out if you are at risk for hepatitis B infection. Hepatitis  C Testing is recommended for: Everyone born from [birth year] through 1963. Anyone with known risk factors for hepatitis C. Sexually transmitted infections (STIs) Get screened for STIs, including gonorrhea and chlamydia, if: You are sexually active and are younger than 41 years of age. You are older than 41 years of age and your health care provider tells you that you are at risk for this type of infection. Your sexual activity has changed since you were last screened, and you are at increased risk for chlamydia or gonorrhea. Ask your health care provider if you are at risk. Ask your health care provider about whether you are at high risk for HIV. Your health care provider may recommend a prescription medicine to help prevent HIV infection. If you choose to take medicine to prevent HIV, you should first get tested for HIV. You should then be tested every 3 months for as long as you are taking the medicine. Pregnancy If you are about to stop having your period (premenopausal) and you may become pregnant, seek counseling before you get pregnant. Take 400 to 800 micrograms (mcg) of folic acid every day if you become pregnant. Ask for birth control (contraception) if you want to prevent pregnancy. Osteoporosis and menopause Osteoporosis is a disease in which the bones lose minerals and strength with aging. This can result in bone fractures. If you are 63 years old or older, or if you are at risk for osteoporosis and fractures, ask your health care provider if you should: Be screened for bone loss. Take a calcium or vitamin D supplement to lower your risk of fractures. Be given hormone replacement therapy (HRT) to treat symptoms of menopause. Follow these instructions at home: Alcohol use Do not drink alcohol if: Your health care provider tells you not to drink. You are pregnant, may be pregnant, or are planning to become pregnant. If you drink alcohol: Limit how much you have to: 0-1 drink a day. Know  how much alcohol is in your drink. In the [place] one drink equals one 12 oz bottle of beer (355 mL), one 5 oz glass of wine (148 mL), or one 1 oz glass of hard liquor (44 mL). Lifestyle Do not use any products that contain nicotine or tobacco. These products include cigarettes, chewing tobacco, and vaping devices, such as e-cigarettes. If you need help quitting, ask your health care provider. Do not use street drugs. Do not share needles. Ask your health care provider for help if you need support or information about quitting drugs. General instructions Schedule regular health, dental, and eye exams. Stay current with your vaccines. Tell your health care provider if: You often feel depressed. You have ever been abused or do not feel safe at home. Summary Adopting a healthy lifestyle and getting preventive care are important in promoting health and wellness. Follow your health care provider's instructions about healthy diet, exercising, and getting tested or screened for diseases. Follow your health care provider's instructions on monitoring your cholesterol and blood pressure. This information is not intended to replace advice given to you by your health care provider. Make sure you discuss any questions you have with your health care provider. Document  Revised: 03/23/2021 Document Reviewed: 03/23/2021 Elsevier Patient Education  2024 [employer].

## 2024-05-07 NOTE — Assessment & Plan Note (Signed)
We discussed the importance of regular physical activity and healthy diet for prevention of chronic illness and/or complications. Preventive guidelines reviewed. Vaccination up-to-date. Continue her female preventive care with gynecologist. Next CPE in a year.
